# Patient Record
Sex: Male | Born: 1950
Health system: Southern US, Community
[De-identification: ages and names within clinical notes are randomized; demographics above are authoritative.]

## PROBLEM LIST (undated history)

## (undated) DIAGNOSIS — I10 Essential (primary) hypertension: Secondary | ICD-10-CM

## (undated) DIAGNOSIS — E7800 Pure hypercholesterolemia, unspecified: Secondary | ICD-10-CM

## (undated) DIAGNOSIS — E119 Type 2 diabetes mellitus without complications: Secondary | ICD-10-CM

## (undated) DIAGNOSIS — G4730 Sleep apnea, unspecified: Secondary | ICD-10-CM

## (undated) DIAGNOSIS — Q159 Congenital malformation of eye, unspecified: Secondary | ICD-10-CM

## (undated) DIAGNOSIS — I639 Cerebral infarction, unspecified: Secondary | ICD-10-CM

## (undated) HISTORY — PX: COLONOSCOPY: SHX174

## (undated) HISTORY — PX: EYE SURGERY: SHX253

## (undated) HISTORY — PX: CHOLECYSTECTOMY: SHX55

## (undated) HISTORY — PX: HEMORRHOID SURGERY: SHX153

## (undated) HISTORY — PX: APPENDECTOMY: SHX54

## (undated) HISTORY — PX: FRACTURE SURGERY: SHX138

---

## 2003-10-18 ENCOUNTER — Emergency Department (HOSPITAL_COMMUNITY): Admission: EM | Admit: 2003-10-18 | Discharge: 2003-10-18 | Payer: Self-pay | Admitting: Emergency Medicine

## 2003-10-20 ENCOUNTER — Ambulatory Visit (HOSPITAL_COMMUNITY): Admission: RE | Admit: 2003-10-20 | Discharge: 2003-10-20 | Payer: Self-pay | Admitting: Family Medicine

## 2005-07-28 ENCOUNTER — Ambulatory Visit (HOSPITAL_COMMUNITY): Admission: RE | Admit: 2005-07-28 | Discharge: 2005-07-28 | Payer: Self-pay | Admitting: General Surgery

## 2005-07-28 ENCOUNTER — Encounter (INDEPENDENT_AMBULATORY_CARE_PROVIDER_SITE_OTHER): Payer: Self-pay | Admitting: General Surgery

## 2006-07-16 ENCOUNTER — Inpatient Hospital Stay (HOSPITAL_COMMUNITY): Admission: EM | Admit: 2006-07-16 | Discharge: 2006-07-19 | Payer: Self-pay | Admitting: Emergency Medicine

## 2006-07-17 ENCOUNTER — Ambulatory Visit: Payer: Self-pay | Admitting: Cardiology

## 2007-01-22 ENCOUNTER — Ambulatory Visit (HOSPITAL_COMMUNITY): Admission: RE | Admit: 2007-01-22 | Discharge: 2007-01-22 | Payer: Self-pay | Admitting: Family Medicine

## 2007-02-22 ENCOUNTER — Ambulatory Visit (HOSPITAL_COMMUNITY): Admission: RE | Admit: 2007-02-22 | Discharge: 2007-02-22 | Payer: Self-pay | Admitting: Family Medicine

## 2007-02-28 ENCOUNTER — Ambulatory Visit: Payer: Self-pay | Admitting: Internal Medicine

## 2007-03-26 ENCOUNTER — Ambulatory Visit (HOSPITAL_COMMUNITY): Admission: RE | Admit: 2007-03-26 | Discharge: 2007-03-26 | Payer: Self-pay | Admitting: Internal Medicine

## 2007-03-26 ENCOUNTER — Encounter (INDEPENDENT_AMBULATORY_CARE_PROVIDER_SITE_OTHER): Payer: Self-pay | Admitting: Specialist

## 2007-03-26 ENCOUNTER — Ambulatory Visit: Payer: Self-pay | Admitting: Internal Medicine

## 2008-11-28 IMAGING — CT CT CHEST W/ CM
2 of 3 series · 15 of 36 positions shown, 18 images · IV contrast (omnipaque)
Comparison: Comparison is made with chest x-ray 01/22/07.

CLINICAL DATA: Possible lung nodule on chest x-ray 01/22/07.
CHEST CT WITH CONTRAST:
TECHNIQUE: Multidetector CT imaging of the chest was performed following the standard protocol during bolus administration of intravenous contrast.
Contrast:  80 cc Omnipaque 300.

[Series 2: chestroutine 5.0 b40f · axial · 0.78mm/px · z∈[-383,-98]mm · 12 of 67 slices shown, 15 images]
[im 5/67  mediastinal]
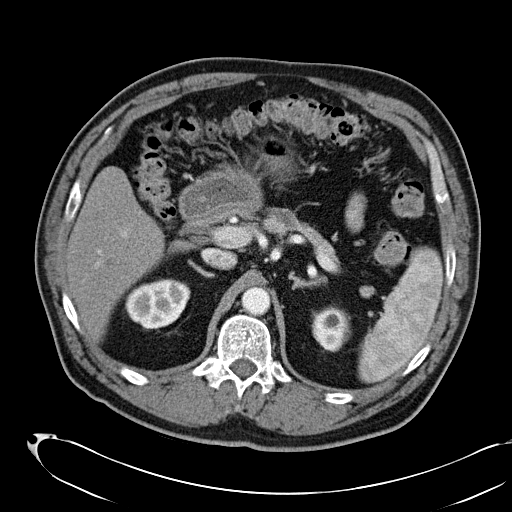
[im 5/67  lung]
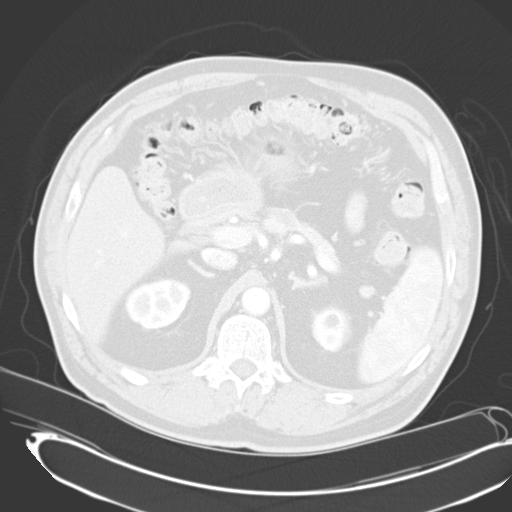
[im 10/67  lung]
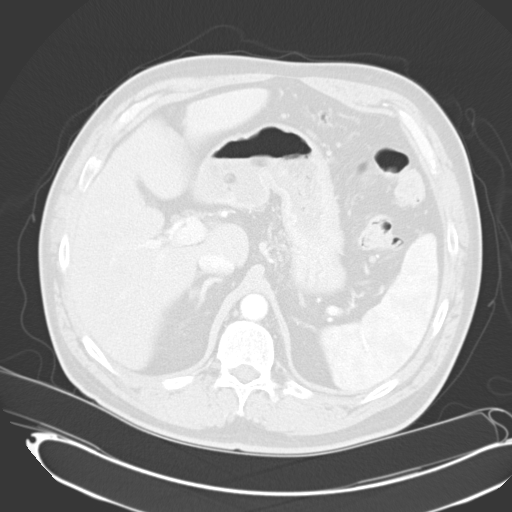
[im 15/67  lung]
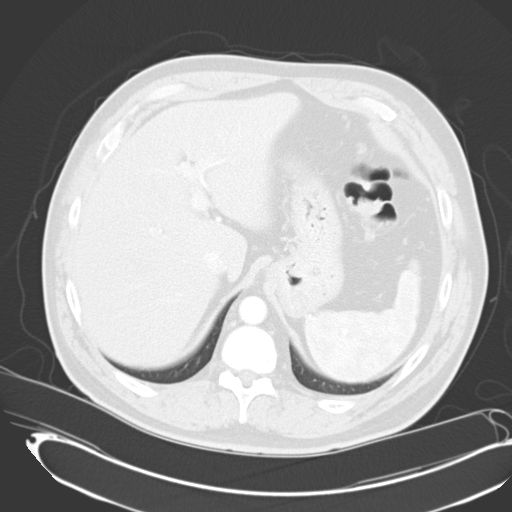
[im 20/67  lung]
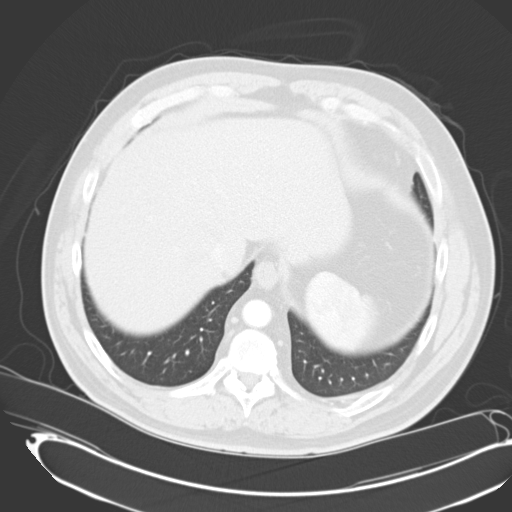
[im 25/67  mediastinal]
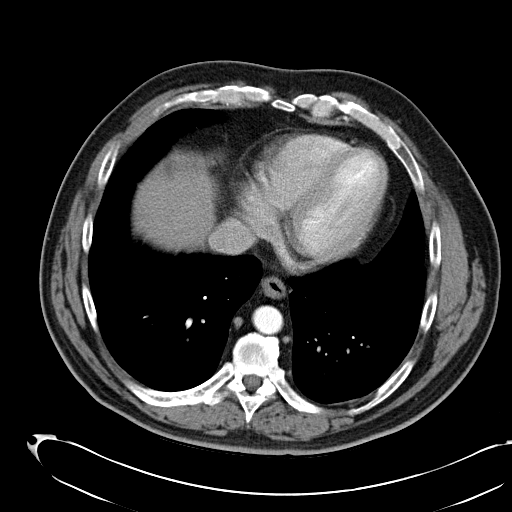
[im 25/67  lung]
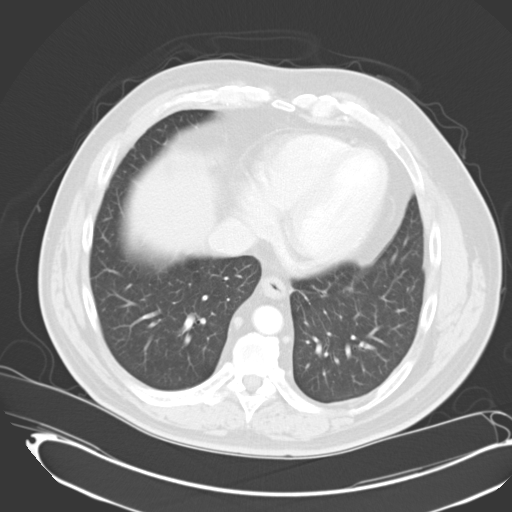
[im 30/67  lung]
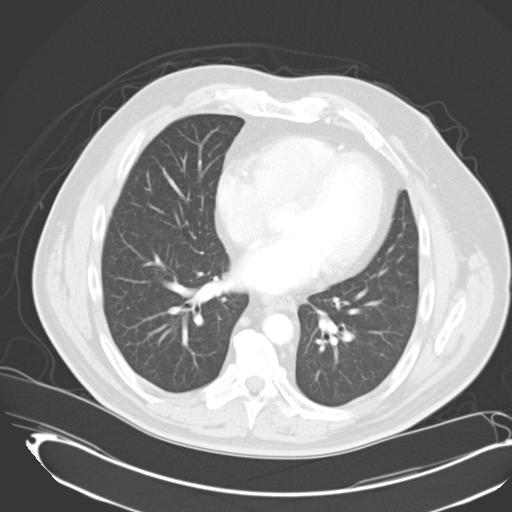
[im 37/67  lung]
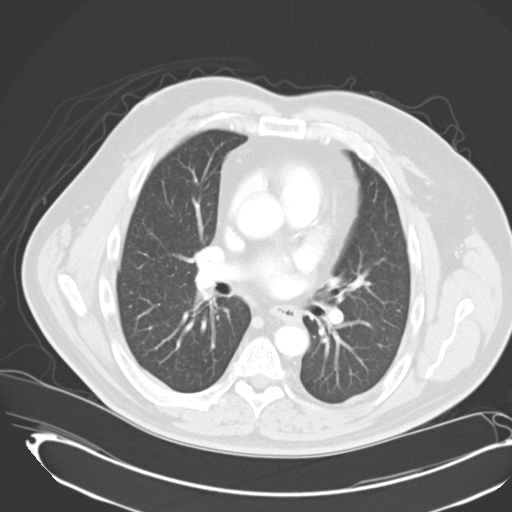
[im 42/67  lung]
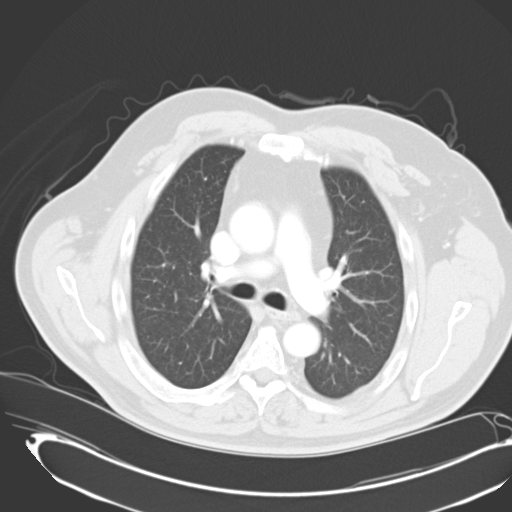
[im 47/67  mediastinal]
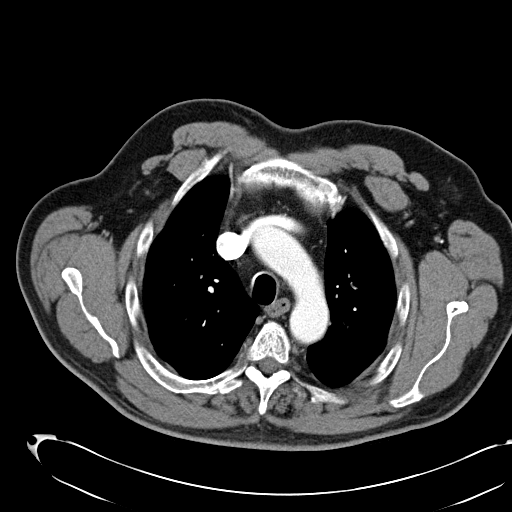
[im 47/67  lung]
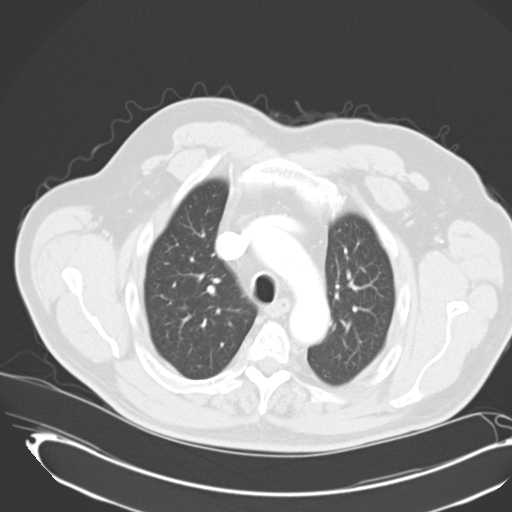
[im 52/67  lung]
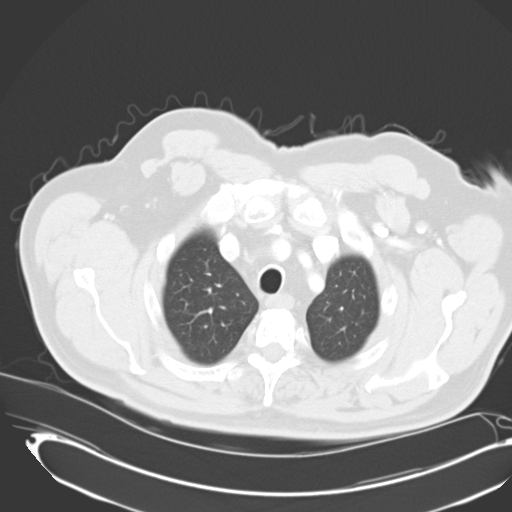
[im 57/67  lung]
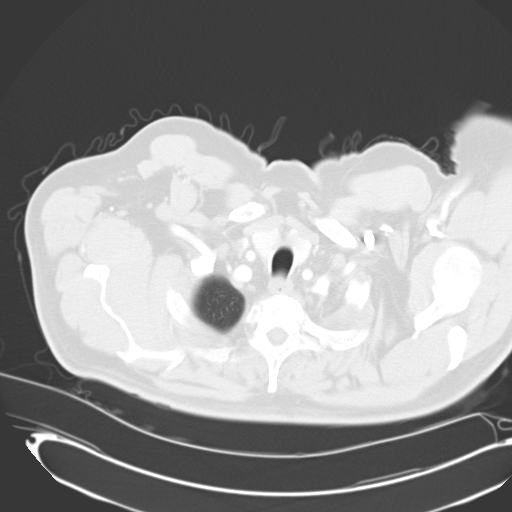
[im 62/67  lung]
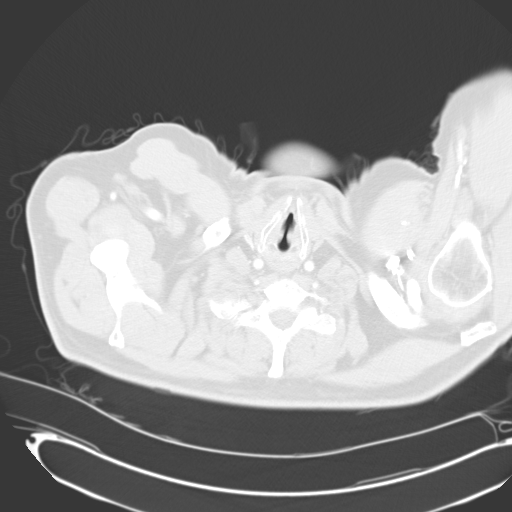

[Series 4: mpr coronal chest · coronal · 0.67mm/px · 3 of 105 slices shown]
[im 21/105  lung]
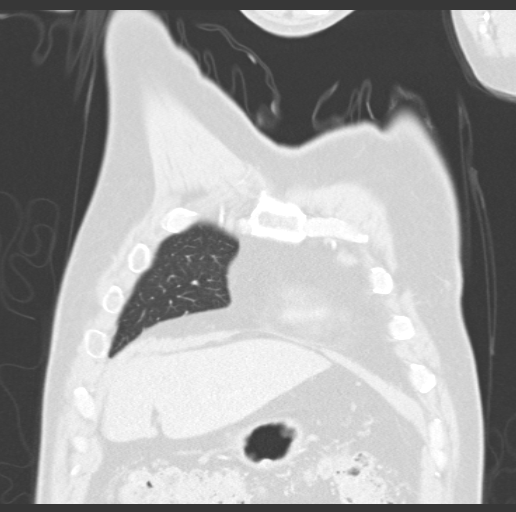
[im 42/105  lung]
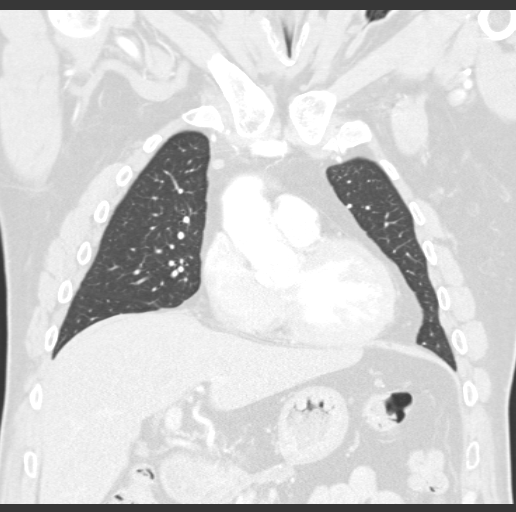
[im 63/105  lung]
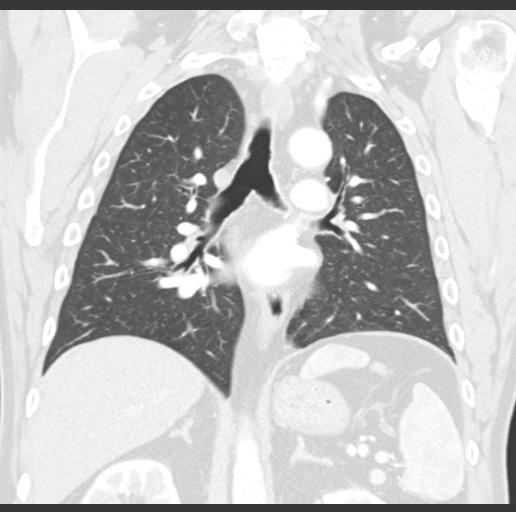

[15 of 36 positions shown; findings below may reference images not displayed]

FINDINGS: No pathologically enlarged mediastinal, hilar, or axillary lymph nodes.  Heart size at the upper limits of normal. No pericardial fluid. Coronary artery calcification.  Lungs are clear. No pulmonary nodule to explain questioned finding on recent chest x-ray.  No pleural fluid.  Airway unremarkable. 
Incidental imaging of the upper abdomen shows stable atrophy of the pancreas with focal low-attenuation lesion or ductal dilatation in the pancreatic body, measuring approximately 11 mm, stable.  Low-attenuation lesion in the upper pole of the left kidney is likely a cyst. No worrisome lytic or sclerotic lesions.
IMPRESSION: 1.  No finding to explain questioned abnormality on recent chest x-ray. 
2.  ancreatic atrophy with focal low-attenuation lesion or ductal prominence, stable from 3449.

## 2009-07-11 ENCOUNTER — Emergency Department (HOSPITAL_COMMUNITY): Admission: EM | Admit: 2009-07-11 | Discharge: 2009-07-11 | Payer: Self-pay | Admitting: Emergency Medicine

## 2009-07-11 ENCOUNTER — Encounter: Payer: Self-pay | Admitting: Orthopedic Surgery

## 2009-07-13 ENCOUNTER — Ambulatory Visit: Payer: Self-pay | Admitting: Orthopedic Surgery

## 2009-07-13 DIAGNOSIS — Z8679 Personal history of other diseases of the circulatory system: Secondary | ICD-10-CM | POA: Insufficient documentation

## 2009-07-13 DIAGNOSIS — S92009A Unspecified fracture of unspecified calcaneus, initial encounter for closed fracture: Secondary | ICD-10-CM

## 2009-07-13 DIAGNOSIS — E119 Type 2 diabetes mellitus without complications: Secondary | ICD-10-CM

## 2009-07-15 ENCOUNTER — Ambulatory Visit: Payer: Self-pay | Admitting: Orthopedic Surgery

## 2009-07-16 ENCOUNTER — Telehealth: Payer: Self-pay | Admitting: Orthopedic Surgery

## 2009-07-30 ENCOUNTER — Telehealth: Payer: Self-pay | Admitting: Orthopedic Surgery

## 2009-07-30 ENCOUNTER — Emergency Department (HOSPITAL_COMMUNITY): Admission: EM | Admit: 2009-07-30 | Discharge: 2009-07-30 | Payer: Self-pay | Admitting: Emergency Medicine

## 2009-11-27 DIAGNOSIS — I639 Cerebral infarction, unspecified: Secondary | ICD-10-CM

## 2009-11-27 HISTORY — DX: Cerebral infarction, unspecified: I63.9

## 2011-03-03 LAB — GLUCOSE, CAPILLARY: Glucose-Capillary: 106 mg/dL — ABNORMAL HIGH (ref 70–99)

## 2011-03-03 LAB — CBC
HCT: 33.9 % — ABNORMAL LOW (ref 39.0–52.0)
Hemoglobin: 11.9 g/dL — ABNORMAL LOW (ref 13.0–17.0)
MCHC: 34.9 g/dL (ref 30.0–36.0)
MCV: 87 fL (ref 78.0–100.0)
Platelets: 250 10*3/uL (ref 150–400)
RBC: 3.9 MIL/uL — ABNORMAL LOW (ref 4.22–5.81)
RDW: 15.3 % (ref 11.5–15.5)
WBC: 7.7 10*3/uL (ref 4.0–10.5)

## 2011-03-03 LAB — URINALYSIS, ROUTINE W REFLEX MICROSCOPIC
Bilirubin Urine: NEGATIVE
Glucose, UA: NEGATIVE mg/dL
Hgb urine dipstick: NEGATIVE
Ketones, ur: NEGATIVE mg/dL
Nitrite: NEGATIVE
Protein, ur: NEGATIVE mg/dL
Specific Gravity, Urine: 1.02 (ref 1.005–1.030)
Urobilinogen, UA: 1 mg/dL (ref 0.0–1.0)
pH: 5.5 (ref 5.0–8.0)

## 2011-03-03 LAB — DIFFERENTIAL
Basophils Absolute: 0 10*3/uL (ref 0.0–0.1)
Basophils Relative: 0 % (ref 0–1)
Eosinophils Absolute: 0 10*3/uL (ref 0.0–0.7)
Eosinophils Relative: 1 % (ref 0–5)
Lymphocytes Relative: 16 % (ref 12–46)
Lymphs Abs: 1.3 10*3/uL (ref 0.7–4.0)
Monocytes Absolute: 0.6 10*3/uL (ref 0.1–1.0)
Monocytes Relative: 8 % (ref 3–12)
Neutro Abs: 5.8 10*3/uL (ref 1.7–7.7)
Neutrophils Relative %: 75 % (ref 43–77)

## 2011-03-03 LAB — BASIC METABOLIC PANEL
BUN: 58 mg/dL — ABNORMAL HIGH (ref 6–23)
CO2: 24 mEq/L (ref 19–32)
Calcium: 9.5 mg/dL (ref 8.4–10.5)
Chloride: 101 mEq/L (ref 96–112)
Creatinine, Ser: 2.64 mg/dL — ABNORMAL HIGH (ref 0.4–1.5)
GFR calc Af Amer: 30 mL/min — ABNORMAL LOW (ref >60)
GFR calc non Af Amer: 25 mL/min — ABNORMAL LOW (ref >60)
Glucose, Bld: 83 mg/dL (ref 70–99)
Potassium: 5.1 mEq/L (ref 3.5–5.1)
Sodium: 133 mEq/L — ABNORMAL LOW (ref 135–145)

## 2011-04-14 NOTE — H&P (Signed)
NAME:  Marc Haynes, Marc Haynes NO.:  [id number]   MEDICAL RECORD NO.:  [id number]          PATIENT TYPE:  INP   LOCATION:  A210                          FACILITY:  [facility]   PHYSICIAN:  Patrica Duel, M.D.    DATE OF BIRTH:  [birth year]   DATE OF ADMISSION:  07/17/2006  DATE OF DISCHARGE:  LH                                HISTORY & PHYSICAL   CHIEF COMPLAINT:  Weakness of the left arm and leg.   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  He has 27-year history of diabetes mellitus and has been poorly  controlled for long-term period.  Of recent the patient has been seen by  endocrinology, his regimen changed and currently his fasting sugars are near  normal.  Hemoglobin A1c is pending.  He also has a history of hypertension  and hyperlipidemia.  Surgical history is significant for cholecystectomy  only.   The patient was doing well until he awoke the morning of the 20th with  numbness in his left arm and leg.  He had no headache, dysarthria,  dysphasia, visual disturbances.  He stated he was dragging his foot briefly.  Presented emergency department for evaluation.   Diagnostics in the ER included MRI and MRA which revealed a presence of a  thalamic infarct on the left.  MRA revealed some moderate stenosis of  internal carotid system, full report is currently pending.   The patient was admitted for ongoing management, further evaluation, therapy  as indicated.   There is no history of head trauma, nausea, vomiting, diarrhea, chest pain,  shortness of breath, palpitations, melena, hematemesis, hematochezia,  genitourinary symptoms.   Current medications include Novolin 70/30 variable doses from 50 to 80 units  b.i.d., metformin 2 grams q.h.s., Crestor 20 mg q.d., HCTZ 12.5,  lisinopril  40 q.d., Actos 45 q.d., aspirin 81 q.d. Ambien q.h.s. p.r.n.   ALLERGIES:  None documented.   PAST MEDICAL HISTORY:  Noted above.   FAMILY HISTORY:  Is significant for diabetes in his mother as well as two  grandparents which both suffered severe CVAs at young ages.   REVIEW OF SYSTEMS:  Negative except as mentioned.   SOCIAL HISTORY:  Nonsmoker, nondrinker, active lifestyle.   PHYSICAL EXAMINATION:  A very pleasant fully alert male, currently feels  somewhat improved from his present presenting status.  VITAL SIGNS:  Presentation temperature 97.1, BP 144/78, pulse 96 regular,  respirations 20, O2 sat 95%.  HEENT: Normocephalic, atraumatic.  Pupils equal.  There was no facial droop.  Ears, nose throat benign.  Neck is supple.  No audible bruits.  LUNGS: Clear to A&P.  HEART:  Sounds are normal without murmurs, rubs or gallops.  ABDOMEN:  Nontender, nondistended, bowel sounds intact.  EXTREMITIES:  No clubbing, cyanosis, edema.  NEUROLOGIC:  Essentially normal.  Toes downgoing.  No focal weakness or  sensory deficits in the distribution alluded to earlier.   ASSESSMENT:  Thalamic infarct and a long-term diabetic probably related to  small vessel disease.   PLAN:  Routine evaluation with Doppler studies,  echocardiogram, physical  therapy, occupational therapy and speech therapy consult/Dr. Darleen Crocker A.  Gerilyn Pilgrim, M.D. to comment.  Empiric therapy with Plavix and aspirin.  Will  continue home medications and follow __________      Patrica Duel, M.D.  Electronically Signed     MC/MEDQ  D:  07/17/2006  T:  07/17/2006  Job:  [id number]

## 2011-04-14 NOTE — Consult Note (Signed)
NAME:  Marc Haynes, Marc Haynes              ACCOUNT NO.:  [id number]   MEDICAL RECORD NO.:  [id number]          PATIENT TYPE:  AMB   LOCATION:  DAY                           FACILITY:  [facility]   PHYSICIAN:  Lionel December, M.D.    DATE OF BIRTH:  [birth year]   DATE OF CONSULTATION:  DATE OF DISCHARGE:                                 CONSULTATION   PRESENTING COMPLAINT:  Dysphagia.  The patient also interested in a  screening colonoscopy.   HISTORY OF PRESENT ILLNESS:  Marc Haynes is a 60 year old Caucasian male who  is referred through courtesy of Dr. Phillips Odor for GI evaluation.  he is  accompanied by his a friend.Fayrene Fearing states that he has had dysphagia  off and on for a few years.  He has difficulty both with liquids as well  as solids.  He points to mid sternal area as site of bolus obstruction.  Generally the bolus would past down, but he has had a few episodes where  he had to regurgitate liquid or solid in order to get relief.  There is  no history of odynophagia. He has had some hoarseness, but no coughing  and he experiences heartburn at a frequency of less than once a week.  He has a good appetite.  He denies abdominal pain, melena or rectal  bleeding.  He states at times, he may have difficulty swallowing his  pills.  Issue of screening colonoscopy was discussed by Dr. Phillips Odor with  the patient and he is also interested having this exam done at the time  of evaluation for dysphagia.   REVIEW OF SYSTEMS:  Negative for weight loss, chest pain or dyspnea.   He is on  1. Actos 45 mg every day.  2. Crestor 20 mg every day.  3. Hydrochlorothiazide 25 mg daily.  4. Plavix 75 mg daily.  5. Lantus 65 units q.a.m.  6. Rowasa 4 capsules or 4 grams daily, but he is not taking it      regularly.  7. Humalog sliding scale.  8. Metformin 2 grams q.h.s.  9. Benicar 40 mg daily.  10.Tylenol and/or Advil p.r.n. which he does not take very often.  11,  Simply Sleep Tylenol p.r.n.   PAST MEDICAL  HISTORY:  1. He has been diabetic since age 71.  Presently he is under care of      Dr. Leslie Dales.  He says his control has improved a great deal. His      hemoglobin A1c was over 9 and the last one he states was above 7.  2. He has been hypertensive for a few years, but has been on therapy      for 1 year.  States his blood pressure just would fluctuate too      much.  3. History of right CVA in August 2007 with almost complete recovery      of his numbness and weakness.  4. He had open cholecystectomy in 1979 for symptomatic cholelithiasis.  5. He had hemorrhoidectomy in 2006.   ALLERGY:  CORTISONE which induced pancreatitis. He had a shot to his  back in developed pancreatitis by the next morning.   FAMILY HISTORY:  Father died of complications of gallbladder disease at  age 58.  Mother is doing fair.  She has emphysema, diabetes mellitus and  CHF.  One brother died of congenital heart disease at age 83.  He has  three brothers, one is diabetic, two have CAD, one who is 57 has had  CABG, the other one is 48.   SOCIAL HISTORY:  He is single.  He has four children in good health.  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  He smoked for about  20 years, quit 20 years ago and he drinks alcohol maybe once a month or  even less frequently.   PHYSICAL EXAMINATION:  GENERAL:  Pleasant well-developed, mildly obese  Caucasian male who is in no acute distress.  VITAL SIGNS:  He weighs 204 pounds.  He is 5 feet 11 inches tall.  Pulse  88 per minute, blood pressure 146/80, temperature is 98.  HEENT:  Conjunctivae is pink.  Sclerae is nonicteric.  Oral pharyngeal  mucosa is normal.  No thyromegaly or lymphadenopathy noted.  HEART:  Cardiac exam with regular rhythm.  Normal S1-S3.  No murmur or  gallop noted.  LUNGS:  Clear to auscultation.  ABDOMEN: Full, bowel sounds are normal.  No bruits noted.  Palpation  reveals soft abdomen without tenderness, organomegaly or masses.   RECTAL:  Examination deferred.  EXTREMITIES:  No peripheral edema or clubbing noted.   ASSESSMENT:  [name] is a 60 year old Caucasian male who presents with a  several year history of intermittent dysphagia both solids as well as  liquids.  He does not have typical symptoms of gastroesophageal reflux  disease.  I suspect he has Schatzki's ring, but he may well have  esophageal stricture.  Given history of longstanding diabetes mellitus  one has to wonder if he has occult reflux disease due to gastroparesis.  However, he does not have early satiety, nausea or vomiting.  This  symptom will be further evaluated with esophagogastroduodenoscopy.  1. He is average risk for screening esophagogastroduodenoscopy.  2. He is average risk for colorectal carcinoma.   RECOMMENDATIONS:  He will undergo esophagogastroduodenoscopy and  possible esophageal dilation along with screening colonoscopy at [facility] in  the near future.   His Plavix will be held for 3 days, but in all likelihood he will be  able to go back on it on the day of procedure.   He will hold off his Actos and metformin the day before and decrease his  Lantus to 35 units and continue his Humalog via sliding scale.  Should  he develop hypoglycemic symptoms, he is free to use orange juice.  I  have reviewed both procedures with the patient and he is agreeable.   We appreciate the opportunity to participate in the care of this  gentleman.      Lionel December, M.D.  Electronically Signed     NR/MEDQ  D:  02/28/2007  T:  03/01/2007  Job:  [id number]   cc:   Veverly Fells. Altheimer, M.D.  Fax: [phone]

## 2011-04-14 NOTE — Consult Note (Signed)
NAME:  Marc Haynes, Marc Haynes              ACCOUNT NO.:  [id number]   MEDICAL RECORD NO.:  [id number]          PATIENT TYPE:  INP   LOCATION:  A210                          FACILITY:  [facility]   PHYSICIAN:  Kofi A. Gerilyn Pilgrim, M.D. DATE OF BIRTH:  Feb 06, 1951   DATE OF CONSULTATION:  07/17/2006  DATE OF DISCHARGE:                                   CONSULTATION   HISTORY:  This is a 60 year old white male who presents with numbness and  tingling in the left upper extremity and left leg.  He also had some  symptoms involving the left chest, but only a small portion.  The patient's  symptoms lasted for several hours to a day.  He feels as if he is almost  back to baseline.   PAST MEDICAL HISTORY:  1. Significant for a 27-year history of diabetes, which has been poorly      controlled until recently.  2. History of hypertension.  3. Dyslipidemia.   MEDICATIONS:  The admission medications were insulin, metformin, Crestor,  hydrochlorothiazide, Lisinopril, Actos, aspirin, and Ambien.   ALLERGIES:  None.   REVIEW OF SYSTEMS:  Unremarkable other than as in the History of Present  Illness.   FAMILY HISTORY:  Significant for several members who have cerebrovascular  disease at an early age.   PHYSICAL EXAMINATION:  VITAL SIGNS:  Temperature 97.4.  Pulse 74.  Respirations 20.  Blood pressure 127/77.  HEENT:  Evaluation shows neck is supple.  Head is normocephalic, atraumatic.  LUNGS:  The patient has significant crowding of the posterior air space  suggesting possibly a risk for apnea.  MENTATION:  The patient is awake and alert.  No dysarthria is noted.  Speech  is normal.  Language and cognition are intact.  NEUROLOGIC:  On cranial nerve evaluation, pupils are equal, round, and  reactive to light and accommodation.  The extraocular movements are full.  Facial muscles are symmetric.  Tongue is midline.  Uvula is midline.  Shoulder shrugs are normal.  Motor examination shows normal tone, bulk,  and  strength.  Sensation today reveals normal sensory exam to temperature and  light touch.  Coordination is intact.  Reflexes are actually preserved.   DIAGNOSTIC STUDIES:  MRI of the brain shows a right thalamic acute infarct.  It also involved the internal capsule on the same side.  There are no  specific white matter changes thought to be ischemic in nature.   MRA shows some irregular arteries involving the right A1 of the anterior  cerebral artery.  Otherwise, intracranial vasculature looks good.  The  extracranial vasculature also looks good.   Carotid Doppler shows no hemodynamically significant stenosis.   Echocardiography shows normal systolic function and otherwise unremarkable  exam.   ASSESSMENT:  1. Acute right thalamic infarct.  2. Risk factors of hypertension, diabetes, and dyslipidemia.   RECOMMENDATIONS:  1. He is currently on aspirin and Plavix, which I think is fine.  Long-      term he will only need one antiplatelet agent.  2. Additional blood testing for a homocystine level.  3. I think there  is a good chance that he could have untreated apnea,      which should be addressed to further reduce cardiovascular and      cerebrovascular risks.   Thanks for this consultation.      Kofi A. Gerilyn Pilgrim, M.D.  Electronically Signed     KAD/MEDQ  D:  07/18/2006  T:  07/18/2006  Job:  [id number]

## 2011-04-14 NOTE — Procedures (Signed)
NAME:  Marc Haynes, Marc Haynes NO.:  [id number]   MEDICAL RECORD NO.:  [id number]          PATIENT TYPE:  INP   LOCATION:  A210                          FACILITY:  [facility]   PHYSICIAN:  Gerrit Friends. Dietrich Pates, MD, FACCDATE OF BIRTH:  1951-09-29   DATE OF PROCEDURE:  07/17/2006  DATE OF DISCHARGE:                                  ECHOCARDIOGRAM   REFERRING [facility] CLINICAL DATA:  This is a 59 year old gentleman with CVA  and hypertension.   M-MODE:  Aorta 3.6, left atrium 3.6, septum 1.3, posterior wall 1.3, LV  diastole 3.8, LV systole 2.3.   1. Technically suboptimal but adequate echocardiographic study.  2. Left atrial size at the upper limit of normal; normal right atrium and      right ventricle.  3. Normal aortic, mitral, tricuspid and pulmonic valves; systolic anterior      motion of the cords, which is a normal variant.  4. Normal pulmonary artery and aortic arch.  5. Normal left ventricular size; mild concentric hypertrophy; normal      regional and global function.  6. Normal regional and global function.  7. Normal IVC.      Gerrit Friends. Dietrich Pates, MD, [facility]  Electronically Signed     RMR/MEDQ  D:  07/18/2006  T:  07/18/2006  Job:  [id number]

## 2011-04-14 NOTE — Discharge Summary (Signed)
NAME:  Marc Haynes, Marc Haynes NO.:  [id number]   MEDICAL RECORD NO.:  [id number]          PATIENT TYPE:  INP   LOCATION:  A210                          FACILITY:  [facility]   PHYSICIAN:  Patrica Duel, M.D.    DATE OF BIRTH:  [birth year]   DATE OF ADMISSION:  07/16/2006  DATE OF DISCHARGE:  08/23/2007LH                                 DISCHARGE SUMMARY   DISCHARGE DIAGNOSES:  1. Thalamic infarct documented by MRA revealing bilateral carotid      bifurcation and proximal internal carotid artery plaque without      hemodynamically significant stenosis.  Focal stenosis of the origin of      the left external carotid of questionable significance.  Symptoms      resolved, stable at this time.  2. A 27-year history of diabetes mellitus with poor control for long-term,      recently much improved control.  3. Hypertension.  4. Hyperlipidemia.  5. Status post cholecystectomy.   For details regarding admission please refer to the admitting note.  Briefly, this 60 year old male with the above history was doing well until  he woke the morning of the 20th with numbness of his left arm and leg.  He  had some gait disturbance, but no other neurologic deficits.   He was seen in the emergency department and an MRI was performed which  revealed the presence of a thalamic infarct on the left.  MRA results as  noted above.   The patient was admitted for ongoing management, further evaluation and  therapy of acute CVA.   COURSE IN THE HOSPITAL:  Patient did remarkably well in the hospital with  near total resolution of his symptoms within 24 hours of onset.  The patient  was begun on Plavix and aspirin immediately.  He was seen by Dr. Gerilyn Pilgrim  who agreed with current management.  Homocysteine level was obtained which  was negative.  He has had no swallowing difficulties or ongoing gait  disturbances.  He is stable for discharge at this time.   DISPOSITION:  Medication will include Plavix  75 mg daily, Novolin 70/30 in  variable doses on sliding scale as per endocrinology, metformin 2 g nightly,  Crestor 20 mg daily, HCTZ 12.5 daily, lisinopril 40 daily, Actos 45 mg  daily, aspirin 81 daily, and Ambien nightly p.r.n. in addition of Plavix.  He will be followed and treated expectantly in the office as well as by Dr.  Gerilyn Pilgrim.  A sleep study will be performed.      Patrica Duel, M.D.  Electronically Signed     MC/MEDQ  D:  07/19/2006  T:  07/19/2006  Job:  [id number]

## 2011-04-14 NOTE — Op Note (Signed)
NAME:  Marc Haynes, Marc Haynes              ACCOUNT NO.:  [id number]   MEDICAL RECORD NO.:  [id number]          PATIENT TYPE:  AMB   LOCATION:  DAY                           FACILITY:  [facility]   PHYSICIAN:  Lionel December, M.D.    DATE OF BIRTH:  December 05, 1950   DATE OF PROCEDURE:  03/26/2007  DATE OF DISCHARGE:                               OPERATIVE REPORT   PROCEDURE:  Esophagogastroduodenoscopy with esophageal dilation followed  by colonoscopy.   INDICATIONS:  Khush is a 60 year old African American male who presents  with history of intermittent dysphagia to solids as well as liquids  which he has had for few years but occurring more and more frequently.  He does not have any symptoms to suggest GERD.  He is also undergoing  average risk screening colonoscopy.  The procedure risks were reviewed  the patient, and informed consent was obtained.   MEDICATIONS FOR CONSCIOUS SEDATION:  Benzocaine spray for pharyngeal  topical anesthesia, Demerol 50 mg IV, Versed 10 mg IV in divided dose.   FINDINGS:  The procedure was performed in the endoscopy suite.  The  patient's vital signs and O2 saturations were monitored during the  procedure and remained stable.   PROCEDURE #1, ESOPHAGOGASTRODUODENOSCOPY:  The patient was placed in the  left lateral recumbent position, and the Pentax videoscope was passed  via oropharynx without any difficulty into the esophagus.   Esophagus:  The mucosa of the esophagus was normal.  The GE junction was  at 41 cm from the incisors and was unremarkable.  No mucosal wrinkling  was noted to suggest eosinophilic esophagitis.   Stomach:  Other than a small amount of bile, it was empty.  It distended  very well with insufflation.  The folds of the proximal stomach were  normal.  Examination of the mucosa revealed patchy erythema and  granularity at antrum, but no erosions or ulcers were noted.  The  pyloric channel was patent.  The angularis, fundus and cardia examined  by retroflexing the scope and were normal.   Duodenum:  The bulbar mucosa was normal.  The scope was passed into the  second part of the duodenum where mucosa and folds were normal.   The endoscope was withdrawn.   The esophagus was dilated by passing 54 and 56-French Maloney dilator to  full insertion.  The esophageal mucosa was examined post-dilation, and  there was no mucosal disruption noted.  The endoscope was withdrawn and  the patient prepared for procedure #2.   PROCEDURE #2, COLONOSCOPY:  Rectal examination was performed.  No  abnormality noted external or digital exam.  The Pentax videoscope was  placed in the rectum and advanced under vision into the sigmoid colon  and beyond.  The preparation was satisfactory.  The scope was passed  into the cecum which was identified by appendiceal orifice and ileocecal  valve which was lipomatous.  Pictures were taken for the record.  As the  scope was withdrawn, the colonic mucosa was carefully examined.  There  was a 4-mm polyp at ascending colon which was ablated via cold biopsy.  The  mucosa of the rest of the colon was normal.  The rectal mucosa  similarly was normal.  The scope was retroflexed to examine the  anorectal junction which was unremarkable.  The endoscope was  straightened and withdrawn.  The patient tolerated the procedure well.   IMPRESSION:  1. Normal esophagogastroduodenoscopy, other than nonerosive antral      gastritis.  Helicobacter pylori infection needs to be ruled out.      2.  Esophagus dilated by passing 54 and 56-French Maloney dilators,      given history of dysphagia but without causing mucosal disruption.  2. A 4-mm polyp ablated via cold biopsy from the ascending colon.   RECOMMENDATIONS:  1. The patient will resume his Plavix and his other medications as      before.  2. I will be contacting the patient with the results of biopsy and      further recommendations.  If he remains with dysphagia, will       consider barium pill study prior to esophageal manometry.      Lionel December, M.D.  Electronically Signed     NR/MEDQ  D:  03/26/2007  T:  03/26/2007  Job:  [id number]   cc:   Corrie Mckusick, M.D.  Fax: [phone]   Veverly Fells. Altheimer, M.D.  Fax: [phone]

## 2011-04-14 NOTE — H&P (Signed)
NAME:  Marc Haynes, Marc Haynes NO.:  [id number]   MEDICAL RECORD NO.:  [id number]           PATIENT TYPE:  AMB   LOCATION:                                FACILITY:  [facility]   PHYSICIAN:  Dalia Heading, M.D.  DATE OF BIRTH:  05-Sep-1951   DATE OF ADMISSION:  07/28/2005  DATE OF DISCHARGE:  LH                                HISTORY & PHYSICAL   CHIEF COMPLAINT:  Thrombosed hemorrhoid.   HISTORY OF PRESENT ILLNESS:  The patient is a 60 year old, white male who is  referred for evaluation and treatment of a thrombosed hemorrhoid.  He was  seen by Dr. Phillips Odor recently and started on Avelox.  He has had problems  with hemorrhoids before.  He started draining pus from the hemorrhoid  yesterday.  He feels better, although he is still sore.   PAST MEDICAL HISTORY:  1.  Non-insulin dependent diabetes mellitus.  2.  Pancreatitis.   PAST SURGICAL HISTORY:  Cholecystectomy.   CURRENT MEDICATIONS:  Insulin 80 units q.a.m., 80 units q.p.m.   ALLERGIES:  No known drug allergies.   REVIEW OF SYSTEMS:  Noncontributory.   PHYSICAL EXAMINATION:  GENERAL:  The patient is a well-developed, well-  nourished, white male in no acute distress.  VITAL SIGNS:  Afebrile, vital signs stable.  LUNGS:  Clear to auscultation with equal breath sounds bilaterally.  HEART:  Regular rate and rhythm without S3, S4 or murmurs.  RECTAL:  Necrotic, draining, external hemorrhoid noted at the 12 o'clock  position.  Examination is limited secondary to pain.   IMPRESSION:  Thrombosed hemorrhoid.   PLAN:  The patient is scheduled for hemorrhoidectomy on July 28, 2005.  The risks and benefits of the procedure including bleeding, infection and  recurrence of hemorrhoidal disease were fully explained to the patient  gaining informed consent.      Dalia Heading, M.D.  Electronically Signed     MAJ/MEDQ  D:  07/25/2005  T:  07/25/2005  Job:  [id number]   cc:   Corrie Mckusick, M.D.  Fax: [phone]

## 2011-04-14 NOTE — Op Note (Signed)
NAME:  MARCUS, SCHWANDT NO.:  [id number]   MEDICAL RECORD NO.:  [id number]          PATIENT TYPE:  AMB   LOCATION:  DAY                           FACILITY:  [facility]   PHYSICIAN:  Dalia Heading, M.D.  DATE OF BIRTH:  [birth year]   DATE OF PROCEDURE:  07/28/2005  DATE OF DISCHARGE:                                 OPERATIVE REPORT   PREOPERATIVE DIAGNOSES:  1.  Hemorrhoidal disease.  2.  Skin tag right buttock.   POSTOPERATIVE DIAGNOSES:  1.  Hemorrhoidal disease.  2.  Skin tag right buttock.   PROCEDURE:  1.  Hemorrhoidectomy.  2.  Excision of skin tag, right buttock.   SURGEON:  Franky Macho, M.D.   ANESTHESIA:  General.   INDICATIONS:  The patient is a 60 year old white male who presents with a  necrotic external hemorrhoid at the 12 o'clock position.  He also has a skin  tag right buttock.  The risks and benefits of both procedures including  bleeding, infection, and recurrence of the hemorrhoidal disease were fully  explained to the patient, who gave informed consent.   DESCRIPTION OF PROCEDURE:  The patient was placed in the lithotomy position  after general anesthesia was administered.  The perineum was prepped and  draped using the usual sterile technique with Betadine.  Surgical site  confirmation was performed.   A necrotic hemorrhoid external hemorrhoid was noted at the 12 o'clock  position. This was excised without difficulty.  Any granulation tissue was  debrided. Mucosal edges were then reapproximated using a 2-0 Vicryl running  suture.  No other significant hemorrhoidal disease was noted.  Rectal  examination was unremarkable.  Sensorcaine 0.5% was instilled in the  surrounding perineum.  Next, a skin tag on the right buttock was excised  using Bovie electrocautery to the skin level without difficulty.  It was  sent to pathology for examination.  Neosporin ointment and dry sterile  dressing were applied.   All tape and needle counts were  correct at the end of the procedure.  The  patient was awakened and transferred to PACU in stable condition.   COMPLICATIONS:  None.   SPECIMENS:  1.  Hemorrhoid.  2.  Skin tag.   ESTIMATED BLOOD LOSS:  Minimal.      Dalia Heading, M.D.  Electronically Signed     MAJ/MEDQ  D:  07/28/2005  T:  07/28/2005  Job:  [id number]   cc:   Corrie Mckusick, M.D.  Fax: [phone]

## 2013-12-05 ENCOUNTER — Other Ambulatory Visit: Payer: Self-pay | Admitting: Ophthalmology

## 2013-12-08 ENCOUNTER — Encounter (HOSPITAL_COMMUNITY): Payer: Self-pay | Admitting: *Deleted

## 2013-12-08 ENCOUNTER — Other Ambulatory Visit (HOSPITAL_COMMUNITY): Payer: Self-pay | Admitting: *Deleted

## 2013-12-08 NOTE — Progress Notes (Signed)
Called Dr. Loraine Leriche office [phone]) and requested most recent EKG and office visit notes. Pt states he will bring his list of meds with him day of surgery, didn't have them when I called him for his pre-op call. He states he takes all his meds at night and will not be taking any the morning of surgery. Also spoke to pt's wife per his request to give her the pre-op information.

## 2013-12-10 ENCOUNTER — Ambulatory Visit (HOSPITAL_COMMUNITY): Payer: 59 | Admitting: Certified Registered Nurse Anesthetist

## 2013-12-10 ENCOUNTER — Encounter (HOSPITAL_COMMUNITY): Payer: 59 | Admitting: Certified Registered Nurse Anesthetist

## 2013-12-10 ENCOUNTER — Encounter (HOSPITAL_COMMUNITY)
Admission: RE | Disposition: A | Discharge status: Home / Self Care | Payer: Self-pay | Source: Ambulatory Visit | Attending: Ophthalmology

## 2013-12-10 ENCOUNTER — Encounter (HOSPITAL_COMMUNITY): Payer: Self-pay | Admitting: *Deleted

## 2013-12-10 ENCOUNTER — Ambulatory Visit (HOSPITAL_COMMUNITY)
Admission: RE | Admit: 2013-12-10 | Discharge: 2013-12-10 | Disposition: A | Discharge status: Home / Self Care | Payer: 59 | Source: Ambulatory Visit | Attending: Ophthalmology | Admitting: Ophthalmology

## 2013-12-10 ENCOUNTER — Ambulatory Visit (HOSPITAL_COMMUNITY): Payer: 59

## 2013-12-10 DIAGNOSIS — H33009 Unspecified retinal detachment with retinal break, unspecified eye: Secondary | ICD-10-CM | POA: Insufficient documentation

## 2013-12-10 DIAGNOSIS — G4730 Sleep apnea, unspecified: Secondary | ICD-10-CM | POA: Insufficient documentation

## 2013-12-10 DIAGNOSIS — I10 Essential (primary) hypertension: Secondary | ICD-10-CM | POA: Insufficient documentation

## 2013-12-10 DIAGNOSIS — Z8673 Personal history of transient ischemic attack (TIA), and cerebral infarction without residual deficits: Secondary | ICD-10-CM | POA: Insufficient documentation

## 2013-12-10 DIAGNOSIS — H33001 Unspecified retinal detachment with retinal break, right eye: Secondary | ICD-10-CM

## 2013-12-10 DIAGNOSIS — Z7982 Long term (current) use of aspirin: Secondary | ICD-10-CM | POA: Insufficient documentation

## 2013-12-10 DIAGNOSIS — Z794 Long term (current) use of insulin: Secondary | ICD-10-CM | POA: Insufficient documentation

## 2013-12-10 DIAGNOSIS — H4310 Vitreous hemorrhage, unspecified eye: Secondary | ICD-10-CM | POA: Insufficient documentation

## 2013-12-10 DIAGNOSIS — E1139 Type 2 diabetes mellitus with other diabetic ophthalmic complication: Secondary | ICD-10-CM | POA: Insufficient documentation

## 2013-12-10 DIAGNOSIS — Z87891 Personal history of nicotine dependence: Secondary | ICD-10-CM | POA: Insufficient documentation

## 2013-12-10 DIAGNOSIS — E11359 Type 2 diabetes mellitus with proliferative diabetic retinopathy without macular edema: Secondary | ICD-10-CM | POA: Insufficient documentation

## 2013-12-10 HISTORY — DX: Essential (primary) hypertension: I10

## 2013-12-10 HISTORY — DX: Cerebral infarction, unspecified: I63.9

## 2013-12-10 HISTORY — DX: Type 2 diabetes mellitus without complications: E11.9

## 2013-12-10 HISTORY — DX: Sleep apnea, unspecified: G47.30

## 2013-12-10 HISTORY — PX: PARS PLANA VITRECTOMY: SHX2166

## 2013-12-10 LAB — BASIC METABOLIC PANEL
BUN: 23 mg/dL (ref 6–23)
CALCIUM: 9.9 mg/dL (ref 8.4–10.5)
CO2: 23 mEq/L (ref 19–32)
CREATININE: 0.92 mg/dL (ref 0.50–1.35)
Chloride: 100 mEq/L (ref 96–112)
GFR calc non Af Amer: 89 mL/min — ABNORMAL LOW (ref >90)
Glucose, Bld: 177 mg/dL — ABNORMAL HIGH (ref 70–99)
Potassium: 4.1 mEq/L (ref 3.7–5.3)
Sodium: 137 mEq/L (ref 137–147)

## 2013-12-10 LAB — GLUCOSE, CAPILLARY
GLUCOSE-CAPILLARY: 170 mg/dL — AB (ref 70–99)
GLUCOSE-CAPILLARY: 131 mg/dL — AB (ref 70–99)
Glucose-Capillary: 131 mg/dL — ABNORMAL HIGH (ref 70–99)

## 2013-12-10 LAB — CBC
HCT: 48 % (ref 39.0–52.0)
Hemoglobin: 16.3 g/dL (ref 13.0–17.0)
MCH: 29.9 pg (ref 26.0–34.0)
MCHC: 34 g/dL (ref 30.0–36.0)
MCV: 88.1 fL (ref 78.0–100.0)
PLATELETS: 142 10*3/uL — AB (ref 150–400)
RBC: 5.45 MIL/uL (ref 4.22–5.81)
RDW: 14.6 % (ref 11.5–15.5)
WBC: 8.5 10*3/uL (ref 4.0–10.5)

## 2013-12-10 SURGERY — PARS PLANA VITRECTOMY WITH 25 GAUGE
Anesthesia: Monitor Anesthesia Care | Site: Eye | Laterality: Right

## 2013-12-10 MED ORDER — SODIUM CHLORIDE 0.9 % IV SOLN
INTRAVENOUS | Status: DC
  Administered 2013-12-10: 12:00:00 via INTRAVENOUS

## 2013-12-10 MED ORDER — LIDOCAINE HCL 2 % IJ SOLN
INTRAMUSCULAR | Status: AC
  Filled 2013-12-10: qty 20

## 2013-12-10 MED ORDER — PROPOFOL 10 MG/ML IV BOLUS
INTRAVENOUS | Status: DC | PRN
  Administered 2013-12-10: 10 mg via INTRAVENOUS
  Administered 2013-12-10: 30 mg via INTRAVENOUS

## 2013-12-10 MED ORDER — GATIFLOXACIN 0.5 % OP SOLN
1.0000 [drp] | OPHTHALMIC | Status: AC | PRN
  Administered 2013-12-10 (×3): 1 [drp] via OPHTHALMIC
  Filled 2013-12-10: qty 2.5

## 2013-12-10 MED ORDER — SODIUM CHLORIDE 0.9 % IJ SOLN
INTRAMUSCULAR | Status: AC
  Filled 2013-12-10: qty 10

## 2013-12-10 MED ORDER — BSS IO SOLN
INTRAOCULAR | Status: DC | PRN
  Administered 2013-12-10: 15 mL via INTRAOCULAR

## 2013-12-10 MED ORDER — CYCLOPENTOLATE HCL 1 % OP SOLN
1.0000 [drp] | OPHTHALMIC | Status: AC | PRN
  Administered 2013-12-10 (×3): 1 [drp] via OPHTHALMIC
  Filled 2013-12-10: qty 2

## 2013-12-10 MED ORDER — HYPROMELLOSE (GONIOSCOPIC) 2.5 % OP SOLN
OPHTHALMIC | Status: AC
  Filled 2013-12-10: qty 15

## 2013-12-10 MED ORDER — GENTAMICIN SULFATE 40 MG/ML IJ SOLN
INTRAMUSCULAR | Status: AC
  Filled 2013-12-10: qty 2

## 2013-12-10 MED ORDER — NA CHONDROIT SULF-NA HYALURON 40-30 MG/ML IO SOLN
INTRAOCULAR | Status: AC
  Filled 2013-12-10: qty 0.5

## 2013-12-10 MED ORDER — EPINEPHRINE HCL 1 MG/ML IJ SOLN
INTRAMUSCULAR | Status: DC | PRN
Start: 2013-12-10 — End: 2013-12-10
  Administered 2013-12-10: .3 mL

## 2013-12-10 MED ORDER — ONDANSETRON HCL 4 MG/2ML IJ SOLN
INTRAMUSCULAR | Status: DC | PRN
  Administered 2013-12-10: 4 mg via INTRAVENOUS

## 2013-12-10 MED ORDER — LIDOCAINE HCL 2 % IJ SOLN
INTRAMUSCULAR | Status: DC | PRN
  Administered 2013-12-10: 10 mL

## 2013-12-10 MED ORDER — TETRACAINE HCL 0.5 % OP SOLN
OPHTHALMIC | Status: AC
  Filled 2013-12-10: qty 2

## 2013-12-10 MED ORDER — BSS PLUS IO SOLN
INTRAOCULAR | Status: AC
  Filled 2013-12-10: qty 500

## 2013-12-10 MED ORDER — PHENYLEPHRINE HCL 2.5 % OP SOLN
1.0000 [drp] | OPHTHALMIC | Status: AC | PRN
  Administered 2013-12-10 (×3): 1 [drp] via OPHTHALMIC
  Filled 2013-12-10: qty 15

## 2013-12-10 MED ORDER — DEXAMETHASONE SODIUM PHOSPHATE 10 MG/ML IJ SOLN
INTRAMUSCULAR | Status: DC | PRN
Start: 2013-12-10 — End: 2013-12-10
  Administered 2013-12-10: 10 mg

## 2013-12-10 MED ORDER — SODIUM CHLORIDE 0.9 % IV SOLN
INTRAVENOUS | Status: DC | PRN
  Administered 2013-12-10: 15:00:00 via INTRAVENOUS

## 2013-12-10 MED ORDER — 0.9 % SODIUM CHLORIDE (POUR BTL) OPTIME
TOPICAL | Status: DC | PRN
  Administered 2013-12-10: 200 mL

## 2013-12-10 MED ORDER — SODIUM HYALURONATE 10 MG/ML IO SOLN
INTRAOCULAR | Status: AC
  Filled 2013-12-10: qty 0.85

## 2013-12-10 MED ORDER — POLYMYXIN B SULFATE 500000 UNITS IJ SOLR
INTRAMUSCULAR | Status: AC
  Filled 2013-12-10: qty 1

## 2013-12-10 MED ORDER — STERILE WATER FOR IRRIGATION IR SOLN
Status: DC | PRN
  Administered 2013-12-10: 200 mL

## 2013-12-10 MED ORDER — EPINEPHRINE HCL 1 MG/ML IJ SOLN
INTRAMUSCULAR | Status: DC | PRN
  Administered 2013-12-10: 15:00:00

## 2013-12-10 MED ORDER — EPINEPHRINE HCL 1 MG/ML IJ SOLN
INTRAMUSCULAR | Status: AC
  Filled 2013-12-10: qty 1

## 2013-12-10 MED ORDER — MIDAZOLAM HCL 5 MG/5ML IJ SOLN
INTRAMUSCULAR | Status: DC | PRN
  Administered 2013-12-10 (×2): 2 mg via INTRAVENOUS

## 2013-12-10 MED ORDER — HYPROMELLOSE (GONIOSCOPIC) 2.5 % OP SOLN
OPHTHALMIC | Status: DC | PRN
  Administered 2013-12-10: 1 [drp] via OPHTHALMIC

## 2013-12-10 MED ORDER — DEXAMETHASONE SODIUM PHOSPHATE 10 MG/ML IJ SOLN
INTRAMUSCULAR | Status: AC
  Filled 2013-12-10: qty 1

## 2013-12-10 SURGICAL SUPPLY — 68 items
APL SRG 3 HI ABS STRL LF PLS (MISCELLANEOUS)
APPLICATOR COTTON TIP 6IN STRL (MISCELLANEOUS) ×3 IMPLANT
APPLICATOR DR MATTHEWS STRL (MISCELLANEOUS) IMPLANT
BLADE MVR KNIFE 20G (BLADE) IMPLANT
CANNULA ANT CHAM MAIN (OPHTHALMIC RELATED) IMPLANT
CANNULA VLV SOFT TIP 25G (OPHTHALMIC) ×1 IMPLANT
CANNULA VLV SOFT TIP 25GA (OPHTHALMIC) ×3 IMPLANT
CORDS BIPOLAR (ELECTRODE) IMPLANT
COVER MAYO STAND STRL (DRAPES) ×3 IMPLANT
DRAPE INCISE 51X51 W/FILM STRL (DRAPES) IMPLANT
DRAPE OPHTHALMIC 77X100 STRL (CUSTOM PROCEDURE TRAY) ×3 IMPLANT
FILTER BLUE MILLIPORE (MISCELLANEOUS) ×2 IMPLANT
FORCEPS ECKARDT ILM 25G SERR (OPHTHALMIC RELATED) ×3 IMPLANT
FORCEPS GRIESHABER ILM 25G A (INSTRUMENTS) IMPLANT
FORCEPS HORIZONTAL 25G DISP (OPHTHALMIC RELATED) IMPLANT
FORCEPS ILM 25G DSP TIP (MISCELLANEOUS) IMPLANT
GAS AUTO FILL CONSTEL (OPHTHALMIC)
GAS AUTO FILL CONSTELLATION (OPHTHALMIC) IMPLANT
GLOVE BIO SURGEON STRL SZ 6 (GLOVE) ×2 IMPLANT
GLOVE SS BIOGEL STRL SZ 8.5 (GLOVE) ×1 IMPLANT
GLOVE SUPERSENSE BIOGEL SZ 8.5 (GLOVE) ×2
GOWN PREVENTION PLUS XLARGE (GOWN DISPOSABLE) ×3 IMPLANT
GOWN STRL NON-REIN LRG LVL3 (GOWN DISPOSABLE) ×3 IMPLANT
HANDLE PNEUMATIC FOR CONSTEL (OPHTHALMIC) IMPLANT
KIT BASIN OR (CUSTOM PROCEDURE TRAY) ×3 IMPLANT
KIT PERFLUORON PROCEDURE 5ML (MISCELLANEOUS) IMPLANT
KIT ROOM TURNOVER OR (KITS) ×3 IMPLANT
KNIFE CRESCENT 2.5 55 ANG (BLADE) IMPLANT
LENS BIOM SUPER VIEW SET DISP (OPHTHALMIC RELATED) ×3 IMPLANT
MASK EYE SHIELD (GAUZE/BANDAGES/DRESSINGS) ×2 IMPLANT
MICROPICK 25G (MISCELLANEOUS)
NDL 18GX1X1/2 (RX/OR ONLY) (NEEDLE) IMPLANT
NDL 25GX 5/8IN NON SAFETY (NEEDLE) IMPLANT
NDL FILTER BLUNT 18X1 1/2 (NEEDLE) IMPLANT
NDL HYPO 25GX1X1/2 BEV (NEEDLE) IMPLANT
NEEDLE 18GX1X1/2 (RX/OR ONLY) (NEEDLE) IMPLANT
NEEDLE 25GX 5/8IN NON SAFETY (NEEDLE) IMPLANT
NEEDLE FILTER BLUNT 18X 1/2SAF (NEEDLE)
NEEDLE FILTER BLUNT 18X1 1/2 (NEEDLE) IMPLANT
NEEDLE HYPO 25GX1X1/2 BEV (NEEDLE) IMPLANT
NS IRRIG 1000ML POUR BTL (IV SOLUTION) ×3 IMPLANT
PACK FRAGMATOME (OPHTHALMIC) IMPLANT
PACK VITRECTOMY CUSTOM (CUSTOM PROCEDURE TRAY) ×3 IMPLANT
PAD ARMBOARD 7.5X6 YLW CONV (MISCELLANEOUS) ×4 IMPLANT
PAD EYE OVAL STERILE LF (GAUZE/BANDAGES/DRESSINGS) ×2 IMPLANT
PAK PIK VITRECTOMY CVS 25GA (OPHTHALMIC) ×3 IMPLANT
PENCIL BIPOLAR 25GA STR DISP (OPHTHALMIC RELATED) IMPLANT
PICK MICROPICK 25G (MISCELLANEOUS) IMPLANT
PROBE LASER ILLUM FLEX CVD 25G (OPHTHALMIC) ×2 IMPLANT
ROLLS DENTAL (MISCELLANEOUS) IMPLANT
SCRAPER DIAMOND 25GA (OPHTHALMIC RELATED) IMPLANT
SET FLUID INJECTOR (SET/KITS/TRAYS/PACK) IMPLANT
STOCKINETTE IMPERVIOUS 9X36 MD (GAUZE/BANDAGES/DRESSINGS) ×6 IMPLANT
STOPCOCK 4 WAY LG BORE MALE ST (IV SETS) IMPLANT
SUT ETHILON 10 0 CS140 6 (SUTURE) IMPLANT
SUT ETHILON 8 0 BV130 4 (SUTURE) IMPLANT
SUT MERSILENE 5 0 RD 1 DA (SUTURE) IMPLANT
SUT PROLENE 10 0 CIF 4 DA (SUTURE) IMPLANT
SUT VICRYL 7 0 TG140 8 (SUTURE) IMPLANT
SYR 30ML SLIP (SYRINGE) IMPLANT
SYR 50ML SLIP (SYRINGE) ×2 IMPLANT
SYR 5ML LL (SYRINGE) IMPLANT
SYR TB 1ML LUER SLIP (SYRINGE) IMPLANT
TAPE SURG TRANSPORE 1 IN (GAUZE/BANDAGES/DRESSINGS) IMPLANT
TAPE SURGICAL TRANSPORE 1 IN (GAUZE/BANDAGES/DRESSINGS) ×2
TOWEL OR 17X26 10 PK STRL BLUE (TOWEL DISPOSABLE) ×6 IMPLANT
WATER STERILE IRR 1000ML POUR (IV SOLUTION) ×3 IMPLANT
WIPE INSTRUMENT VISIWIPE 73X73 (MISCELLANEOUS) ×3 IMPLANT

## 2013-12-10 NOTE — Preoperative (Signed)
Beta Blockers   Reason not to administer Beta Blockers:Not Applicable 

## 2013-12-10 NOTE — Transfer of Care (Signed)
Immediate Anesthesia Transfer of Care Note  Patient: Marc Haynes  Procedure(s) Performed: Procedure(s): PARS PLANA VITRECTOMY 25 GAUGE WITH ENDOLASER RIGHT EYE  (Right)  Patient Location: PACU  Anesthesia Type:MAC  Level of Consciousness: awake, alert , oriented and patient cooperative  Airway & Oxygen Therapy: Patient Spontanous Breathing  Post-op Assessment: Report given to PACU RN, Post -op Vital signs reviewed and stable and Patient moving all extremities X 4  Post vital signs: Reviewed and stable  Complications: No apparent anesthesia complications

## 2013-12-10 NOTE — Progress Notes (Signed)
Call to Dr. Delanna Ahmadi office, [facility] requested last OV note & last EKG.

## 2013-12-10 NOTE — Anesthesia Postprocedure Evaluation (Signed)
  Anesthesia Post-op Note  Patient: Marc Haynes  Procedure(s) Performed: Procedure(s): PARS PLANA VITRECTOMY 25 GAUGE WITH ENDOLASER RIGHT EYE  (Right)  Patient Location: PACU  Anesthesia Type:MAC  Level of Consciousness: awake, alert  and oriented  Airway and Oxygen Therapy: Patient Spontanous Breathing  Post-op Pain: none  Post-op Assessment: Post-op Vital signs reviewed, Patient's Cardiovascular Status Stable, Respiratory Function Stable, Patent Airway, No signs of Nausea or vomiting and Pain level controlled  Post-op Vital Signs: Reviewed and stable  Complications: No apparent anesthesia complications

## 2013-12-10 NOTE — H&P (Signed)
Marc Haynes is an 63 y.o. male.   Chief Complaint: Vision loss right eye with floaters. HPI: Dense preretinal and vitreous hemorrhage of the right eye from proliferative diabetic retinopathy progressive despite panretinal photocoagulation.  Past Medical History  Diagnosis Date  . Hypertension   . Diabetes mellitus without complication   . Sleep apnea     does not use CPAP  . Stroke 2011    Past Surgical History  Procedure Laterality Date  . Cholecystectomy    . Fracture surgery Left     lower leg/ankle  . Eye surgery Left     Vitrectomy, bilateral cataracts  . Appendectomy    . Colonoscopy    . Hemorrhoid surgery  2007?    History reviewed. No pertinent family history. Social History:  reports that he quit smoking about 26 years ago. His smoking use included Cigarettes. He smoked 0.00 packs per day. He has never used smokeless tobacco. He reports that he drinks alcohol. He reports that he does not use illicit drugs.  Allergies:  Allergies  Allergen Reactions  . Cortisone     Medications Prior to Admission  Medication Sig Dispense Refill  . aspirin 325 MG tablet Take 325 mg by mouth daily.      . clopidogrel (PLAVIX) 75 MG tablet Take 75 mg by mouth daily with breakfast.      . esomeprazole (NEXIUM) 40 MG capsule Take 40 mg by mouth daily at 12 noon.      . hydrochlorothiazide (MICROZIDE) 12.5 MG capsule Take 12.5 mg by mouth daily.      . insulin glargine (LANTUS) 100 UNIT/ML injection Inject 80 Units into the skin at bedtime.      . insulin lispro (HUMALOG) 100 UNIT/ML injection Inject 12-22 Units into the skin 3 (three) times daily before meals. Sliding scale      . losartan (COZAAR) 100 MG tablet Take 100 mg by mouth daily.      . metFORMIN (GLUCOPHAGE) 1000 MG tablet Take 4,000 mg by mouth at bedtime.      . Multiple Vitamin (MULTIVITAMIN WITH MINERALS) TABS tablet Take 1 tablet by mouth daily.      Marland Kitchen omega-3 acid ethyl esters (LOVAZA) 1 G capsule Take 4,000 mg  by mouth daily.      . pioglitazone (ACTOS) 45 MG tablet Take 45 mg by mouth daily.      . rosuvastatin (CRESTOR) 20 MG tablet Take 20 mg by mouth daily.      Marland Kitchen VICTOZA 18 MG/3ML SOPN Inject 1.6 mg into the skin daily.        Results for orders placed during the hospital encounter of 12/10/13 (from the past 48 hour(s))  CBC     Status: Abnormal   Collection Time    12/10/13 11:46 AM      Result Value Range   WBC 8.5  4.0 - 10.5 K/uL   RBC 5.45  4.22 - 5.81 MIL/uL   Hemoglobin 16.3  13.0 - 17.0 g/dL   HCT 48.0  39.0 - 52.0 %   MCV 88.1  78.0 - 100.0 fL   MCH 29.9  26.0 - 34.0 pg   MCHC 34.0  30.0 - 36.0 g/dL   RDW 14.6  11.5 - 15.5 %   Platelets 142 (*) 150 - 400 K/uL  GLUCOSE, CAPILLARY     Status: Abnormal   Collection Time    12/10/13 11:46 AM      Result Value Range   Glucose-Capillary 170 (*)  70 - 99 mg/dL   No results found.  Review of Systems  Constitutional: Negative.   HENT: Negative.   Eyes: Positive for blurred vision.  Respiratory: Negative.   Cardiovascular: Negative.   Gastrointestinal: Negative.   Skin: Negative.   Neurological: Negative.   Endo/Heme/Allergies: Negative.   Psychiatric/Behavioral: Negative.     Blood pressure 175/99, pulse 95, temperature 97.9 F (36.6 C), temperature source Oral, resp. rate 20, height 5\' 11"  (1.803 m), weight 97.07 kg (214 lb), SpO2 96.00%. Physical Exam  Constitutional: He is oriented to person, place, and time. Vital signs are normal. He appears well-developed and well-nourished.  HENT:  Head: Normocephalic and atraumatic.  Eyes: Pupils are equal, round, and reactive to light.  Vision OD is 20/160 and OS is vision of 20/20.  Dense vitreous hemorrhage is present over the macular of the right eye secondary to proliferative diabetic retinopathy.  Quiescent  proliferative diabetic retinopathy present left eye  Neck: Trachea normal and normal range of motion. Neck supple.  Cardiovascular: Normal rate and regular  rhythm.   Musculoskeletal: Normal range of motion.  Neurological: He is alert and oriented to person, place, and time. He has normal reflexes.  Skin: Skin is warm.  Psychiatric: He has a normal mood and affect. His speech is normal and behavior is normal. Judgment and thought content normal. Cognition and memory are normal.     Assessment/Plan #1 vitreous hemorrhage right eye from active proliferative diabetic retinopathy. #2 progressive proliferative diabetic retinopathy despite panretinal photocoagulation right eye.    Plan vitrectomy posterior, with endolaser panretinal photocoagulation right eye. Under local retrobulbar with monitored anesthesia control.  Jarmon Javid A 12/10/2013, 12:38 PM

## 2013-12-10 NOTE — Brief Op Note (Signed)
12/10/2013  3:32 PM  PATIENT:  Marc Haynes  63 y.o. male  PRE-OPERATIVE DIAGNOSIS:  VITREOUS HEMORRAGIE RIGHT EYE   POST-OPERATIVE DIAGNOSIS:  VITREOUS HEMORRAGIE RIGHT EYE   PROCEDURE:  Procedure(s): PARS PLANA VITRECTOMY 25 GAUGE WITH ENDOLASER RIGHT EYE  (Right),,, injection of SF 6 18% GAS, to repair retinal detachment  SURGEON:  Surgeon(s) and Role:    * [name], MD - Primary  PHYSICIAN ASSISTANT:   ASSISTANTS: none   ANESTHESIA:   MAC  EBL:     BLOOD ADMINISTERED:none  DRAINS: none   LOCAL MEDICATIONS USED:  XYLOCAINE 10 cc retrobulbar  SPECIMEN:  No Specimen  DISPOSITION OF SPECIMEN:  N/A  COUNTS:  YES  TOURNIQUET:  * No tourniquets in log *  DICTATION: .Other Dictation: Dictation Number [id number]  PLAN OF CARE: Discharge to home after PACU  PATIENT DISPOSITION:  PACU - hemodynamically stable.   Delay start of Pharmacological VTE agent (>24hrs) due to surgical blood loss or risk of bleeding: not applicable

## 2013-12-10 NOTE — Anesthesia Preprocedure Evaluation (Signed)
Anesthesia Evaluation  Patient identified by MRN, date of birth, ID band Patient awake    Reviewed: Allergy & Precautions, H&P , NPO status , Patient's Chart, lab work & pertinent test results  Airway   Neck ROM: Full    Dental   Pulmonary sleep apnea , former smoker,  breath sounds clear to auscultation        Cardiovascular hypertension, Rhythm:Regular Rate:Normal     Neuro/Psych    GI/Hepatic   Endo/Other  diabetes  Renal/GU      Musculoskeletal   Abdominal   Peds  Hematology   Anesthesia Other Findings   Reproductive/Obstetrics                           Anesthesia Physical Anesthesia Plan  ASA: III  Anesthesia Plan: MAC   Post-op Pain Management:    Induction: Intravenous  Airway Management Planned: Natural Airway  Additional Equipment:   Intra-op Plan:   Post-operative Plan:   Informed Consent: I have reviewed the patients History and Physical, chart, labs and discussed the procedure including the risks, benefits and alternatives for the proposed anesthesia with the patient or authorized representative who has indicated his/her understanding and acceptance.   Dental advisory given  Plan Discussed with: CRNA and Surgeon  Anesthesia Plan Comments:         Anesthesia Quick Evaluation

## 2013-12-11 NOTE — Op Note (Signed)
NAME:  ULAS, ZUERCHER NO.:  [id number]  MEDICAL RECORD NO.:  [id number]  LOCATION:  [facility]                         FACILITY:  [facility]  PHYSICIAN:  Clent Demark. Deniya Craigo, M.D.   DATE OF BIRTH:  04-04-1951  DATE OF PROCEDURE:  12/10/2013 DATE OF DISCHARGE:  12/10/2013                              OPERATIVE REPORT   PREOPERATIVE DIAGNOSIS: 1. Dense vitreous hemorrhage, nonclearing, right eye. 2. History of proliferative diabetic retinopathy, right eye.  POSTOPERATIVE DIAGNOSIS: 1. Dense vitreous hemorrhage, nonclearing, right eye. 2. History of proliferative diabetic retinopathy, right eye. 3. Low lying rhegmatogenous detachment inferotemporally from 6:30 to     the 8 o'clock position anterior to the equator.  PROCEDURE: 1. Posterior vitrectomy with endolaser panretinal photocoagulation as     well as repair of retinal detachment, right eye.  SURGEON:  [name], MD.  ANESTHESIA:  Local retrobulbar with monitored anesthesia control.  INDICATIONS FOR PROCEDURE:  The patient is a 63 year old man with profound vision loss on basis of dense vitreous hemorrhage, nonclearing __________ to be from diabetic retinopathy and/or traction.  The patient understands this in an attempt vitreous opacification and also treat any of the underlying condition that might be found to be present.  The patient understands the risks of anesthesia including rare recurrence of death, loss of the eye from the anesthesia but also from the surgical condition including but not limited to hemorrhage, infection, scarring, need for another surgery, change in vision, loss of vision, progressive disease despite intervention.  Appropriate signed consent was obtained.  DESCRIPTION OF PROCEDURE:  The patient was taken to the operating room. In the operating room, appropriate monitors were followed by mild sedation.  Appropriate sites were confirmed as the right eye with the operating staff.   Thereafter, 2% Xylocaine, 5 mL injected retrobulbar with additional 5 mL laterally in the fashion of modified Kirk Ruths.  The right periocular region was sterilely prepped and draped in usual ophthalmic fashion.  Lid speculum was applied.  A 25-gauge trocar was placed in the inferotemporal quadrant.  Placement was verified visually. Infusion turned on.  Superior trocar was applied.  Core vitrectomy was then begun.  Clearing of the visual axis was successful and dense vitreous was hemorrhage removed in this fashion.  Immediately, there was found to be a low lying detachment inferotemporally at 5:30 position to 8 o'clock position anterior to the equator.  This area was demarcated from posterior extension from previous panretinal photocoagulation was definitely present.  Small retinotomy was created in the posterior aspect of this low lying retinal detachment for later subretinal drainage.  Thick subretinal fluid was removed in this fashion.  The retina flattened nicely and quickly.  All vitreous base was trimmed to this area as well as 360 degrees.  Anterior hyaloid confirmed to be removed.  Median opacity was entirely removed.  At this time, fluid air exchange completed.  Retina reattached nicely and peripherally and subretinal fluid reaccumulated.  Preretinal fluid was aspirated on multiple occasions.  Thereafter, endolaser photocoagulation was placed on the bed of detachment and then finally peripherally and completed the panretinal photocoagulation anteriorly, superiorly, and nasally.  Air- SF6 exchange completed.  18% SF6  was used.  Superior trocars removed from the eye.  Infusion was then removed and the wounds were secured. Intraocular pressure was found to be adequate.  Subconjunctival Decadron applied.  Sterile patch fracture applied.  The patient tolerated the procedure without complications.  The patient was taken to the PACU in good stable condition and tolerating procedure well  without any complications.     Clent Demark Pedrohenrique Mcconville, M.D.     GAR/MEDQ  D:  12/10/2013  T:  12/11/2013  Job:  [id number]

## 2013-12-12 ENCOUNTER — Encounter (HOSPITAL_COMMUNITY): Payer: Self-pay | Admitting: Ophthalmology

## 2015-04-27 ENCOUNTER — Encounter: Payer: Self-pay | Admitting: Cardiovascular Disease

## 2016-12-29 ENCOUNTER — Encounter: Payer: Self-pay | Admitting: Internal Medicine

## 2017-01-25 DIAGNOSIS — E113513 Type 2 diabetes mellitus with proliferative diabetic retinopathy with macular edema, bilateral: Secondary | ICD-10-CM | POA: Diagnosis not present

## 2017-01-25 DIAGNOSIS — G4730 Sleep apnea, unspecified: Secondary | ICD-10-CM | POA: Diagnosis not present

## 2017-01-25 DIAGNOSIS — I10 Essential (primary) hypertension: Secondary | ICD-10-CM | POA: Diagnosis not present

## 2017-01-25 DIAGNOSIS — Z794 Long term (current) use of insulin: Secondary | ICD-10-CM | POA: Diagnosis not present

## 2017-01-25 DIAGNOSIS — E538 Deficiency of other specified B group vitamins: Secondary | ICD-10-CM | POA: Diagnosis not present

## 2017-01-25 DIAGNOSIS — E783 Hyperchylomicronemia: Secondary | ICD-10-CM | POA: Diagnosis not present

## 2017-01-25 DIAGNOSIS — N529 Male erectile dysfunction, unspecified: Secondary | ICD-10-CM | POA: Diagnosis not present

## 2017-01-25 DIAGNOSIS — E782 Mixed hyperlipidemia: Secondary | ICD-10-CM | POA: Diagnosis not present

## 2017-01-25 DIAGNOSIS — E1165 Type 2 diabetes mellitus with hyperglycemia: Secondary | ICD-10-CM | POA: Diagnosis not present

## 2017-01-25 DIAGNOSIS — H3530 Unspecified macular degeneration: Secondary | ICD-10-CM | POA: Diagnosis not present

## 2017-01-25 DIAGNOSIS — G4700 Insomnia, unspecified: Secondary | ICD-10-CM | POA: Diagnosis not present

## 2017-04-05 ENCOUNTER — Encounter (INDEPENDENT_AMBULATORY_CARE_PROVIDER_SITE_OTHER): Payer: Self-pay | Admitting: *Deleted

## 2017-04-09 ENCOUNTER — Emergency Department (HOSPITAL_COMMUNITY)
Admission: EM | Admit: 2017-04-09 | Discharge: 2017-04-09 | Disposition: A | Discharge status: Home / Self Care | Payer: Medicare Other | Attending: Emergency Medicine | Admitting: Emergency Medicine

## 2017-04-09 ENCOUNTER — Emergency Department (HOSPITAL_COMMUNITY): Payer: Medicare Other

## 2017-04-09 ENCOUNTER — Encounter (HOSPITAL_COMMUNITY): Payer: Self-pay | Admitting: Cardiology

## 2017-04-09 DIAGNOSIS — E119 Type 2 diabetes mellitus without complications: Secondary | ICD-10-CM | POA: Diagnosis not present

## 2017-04-09 DIAGNOSIS — Z7982 Long term (current) use of aspirin: Secondary | ICD-10-CM | POA: Diagnosis not present

## 2017-04-09 DIAGNOSIS — Z87891 Personal history of nicotine dependence: Secondary | ICD-10-CM | POA: Diagnosis not present

## 2017-04-09 DIAGNOSIS — J40 Bronchitis, not specified as acute or chronic: Secondary | ICD-10-CM | POA: Diagnosis not present

## 2017-04-09 DIAGNOSIS — R05 Cough: Secondary | ICD-10-CM | POA: Diagnosis not present

## 2017-04-09 DIAGNOSIS — J209 Acute bronchitis, unspecified: Secondary | ICD-10-CM | POA: Diagnosis not present

## 2017-04-09 DIAGNOSIS — Z79899 Other long term (current) drug therapy: Secondary | ICD-10-CM | POA: Insufficient documentation

## 2017-04-09 DIAGNOSIS — Z794 Long term (current) use of insulin: Secondary | ICD-10-CM | POA: Insufficient documentation

## 2017-04-09 DIAGNOSIS — H578 Other specified disorders of eye and adnexa: Secondary | ICD-10-CM | POA: Diagnosis not present

## 2017-04-09 DIAGNOSIS — I10 Essential (primary) hypertension: Secondary | ICD-10-CM | POA: Insufficient documentation

## 2017-04-09 HISTORY — DX: Congenital malformation of eye, unspecified: Q15.9

## 2017-04-09 MED ORDER — GUAIFENESIN-CODEINE 100-10 MG/5ML PO SYRP: 10.0000 mL | ORAL_SOLUTION | Freq: Three times a day (TID) | ORAL | 0 refills | Status: DC | PRN

## 2017-04-09 MED ORDER — ALBUTEROL SULFATE HFA 108 (90 BASE) MCG/ACT IN AERS
2.0000 | INHALATION_SPRAY | Freq: Once | RESPIRATORY_TRACT | Status: AC
  Administered 2017-04-09: 2 via RESPIRATORY_TRACT
  Filled 2017-04-09: qty 6.7

## 2017-04-09 MED ORDER — TOBRAMYCIN 0.3 % OP SOLN
1.0000 [drp] | Freq: Once | OPHTHALMIC | Status: AC
  Administered 2017-04-09: 1 [drp] via OPHTHALMIC
  Filled 2017-04-09: qty 5

## 2017-04-09 MED ORDER — GUAIFENESIN-CODEINE 100-10 MG/5ML PO SOLN
10.0000 mL | Freq: Once | ORAL | Status: AC
  Administered 2017-04-09: 10 mL via ORAL
  Filled 2017-04-09: qty 10

## 2017-04-09 MED ORDER — DOXYCYCLINE HYCLATE 100 MG PO CAPS: 100.0000 mg | ORAL_CAPSULE | Freq: Two times a day (BID) | ORAL | 0 refills | Status: DC

## 2017-04-09 MED ORDER — ALBUTEROL SULFATE (2.5 MG/3ML) 0.083% IN NEBU
5.0000 mg | INHALATION_SOLUTION | Freq: Once | RESPIRATORY_TRACT | Status: AC
  Administered 2017-04-09: 5 mg via RESPIRATORY_TRACT
  Filled 2017-04-09: qty 6

## 2017-04-09 NOTE — ED Provider Notes (Signed)
[facility] DEPT Provider Note   CSN: [id number] Arrival date & time: 04/09/17  1345   By signing my name below, I, Marc Haynes, attest that this documentation has been prepared under the direction and in the presence of Ariany Kesselman, PA-C. Electronically Signed: Eunice Haynes, Scribe. 04/09/17. 6:05 PM.   History   Chief Complaint Chief Complaint  Patient presents with  . Cough   The history is provided by the patient and medical records. No language interpreter was used.    Marc Haynes is a 66 y.o. male with h/o IDDM, who presents to the Emergency Department with concern for gradually worsening cough with sputum progressing in color from green to brown. Associated frequent eye crusting, mild SOB on exertion, sneeze, eye redness, swelling, itching and swelling, nasal congestion progressing to the chest since onset noted. Robitussin, nyquil, allergy medications taken at home with inadequate relief. Pt has cleansed eyes and applied eye drops with no relief to eye crusting and drainage. Pt has exacerbated his eye redness at home by rubbing them. Pt taking prescribed z-pack and states he has 1 left. No noted relief from this intervention. Denies chest pain, vomiting, sore throat, or shortness of breath other than with coughing or exertion.  Past Medical History:  Diagnosis Date  . Diabetes mellitus without complication ([place])   . Eye abnormality    edema of eye .  has had laser surgeries.   . Hypertension   . Sleep apnea    does not use CPAP  . Stroke [facility]) 2011    Patient Active Problem List   Diagnosis Date Noted  . DIABETES 07/13/2009  . CALCANEAL FRACTURE 07/13/2009  . HIGH BLOOD PRESSURE 07/13/2009    Past Surgical History:  Procedure Laterality Date  . APPENDECTOMY    . CHOLECYSTECTOMY    . COLONOSCOPY    . EYE SURGERY Left    Vitrectomy, bilateral cataracts  . FRACTURE SURGERY Left    lower leg/ankle  . HEMORRHOID SURGERY  2007?  Marland Kitchen PARS PLANA VITRECTOMY  Right 12/10/2013   Procedure: PARS PLANA VITRECTOMY 25 GAUGE WITH ENDOLASER RIGHT EYE ;  Surgeon: [name], MD;  Location: [facility];  Service: Ophthalmology;  Laterality: Right;       Home Medications    Prior to Admission medications   Medication Sig Start Date End Date Taking? Authorizing Provider  aspirin 325 MG tablet Take 325 mg by mouth daily.    [provider]  clopidogrel (PLAVIX) 75 MG tablet Take 75 mg by mouth daily with breakfast.    [provider]  esomeprazole (NEXIUM) 40 MG capsule Take 40 mg by mouth daily at 12 noon.    [provider]  hydrochlorothiazide (MICROZIDE) 12.5 MG capsule Take 12.5 mg by mouth daily.    [provider]  insulin glargine (LANTUS) 100 UNIT/ML injection Inject 80 Units into the skin at bedtime.    [provider]  insulin lispro (HUMALOG) 100 UNIT/ML injection Inject 12-22 Units into the skin 3 (three) times daily before meals. Sliding scale    [provider]  losartan (COZAAR) 100 MG tablet Take 100 mg by mouth daily.    [provider]  metFORMIN (GLUCOPHAGE) 1000 MG tablet Take 4,000 mg by mouth at bedtime.    [provider]  Multiple Vitamin (MULTIVITAMIN WITH MINERALS) TABS tablet Take 1 tablet by mouth daily.    [provider]  omega-3 acid ethyl esters (LOVAZA) 1 G capsule Take 4,000 mg by  mouth daily.    [provider]  pioglitazone (ACTOS) 45 MG tablet Take 45 mg by mouth daily.    [provider]  rosuvastatin (CRESTOR) 20 MG tablet Take 20 mg by mouth daily.    [provider]  VICTOZA 18 MG/3ML SOPN Inject 1.6 mg into the skin daily. 12/02/13   [provider]    Family History History reviewed. No pertinent family history.  Social History Social History  Substance Use Topics  . Smoking status: Former Smoker    Types: Cigarettes    Quit date: 12/11/1987  . Smokeless tobacco: Never Used  . Alcohol use Yes      Comment: occasional     Allergies   Cortisone   Review of Systems Review of Systems  Constitutional: Negative for appetite change and fever.  HENT: Positive for congestion and rhinorrhea. Negative for ear pain and sore throat.   Eyes: Positive for discharge, redness and itching.  Respiratory: Positive for cough, chest tightness and shortness of breath.   Cardiovascular: Negative for chest pain.  Gastrointestinal: Negative for abdominal pain, nausea and vomiting.  Genitourinary: Negative for dysuria.  Musculoskeletal: Negative for myalgias.  Skin: Negative for rash.  Neurological: Negative for dizziness, weakness and numbness.  All other systems reviewed and are negative.    Physical Exam Updated Vital Signs BP (!) 175/58   Pulse (!) 105   Temp 98 F (36.7 C) (Oral)   Resp 20   Ht 5\' 11"  (1.803 m)   Wt 200 lb (90.7 kg)   SpO2 97%   BMI 27.89 kg/m   Physical Exam  Constitutional: He is oriented to person, place, and time. He appears well-developed and well-nourished. No distress.  HENT:  Head: Normocephalic.  Right Ear: Tympanic membrane and ear canal normal.  Left Ear: Tympanic membrane and ear canal normal.  Mouth/Throat: Uvula is midline, oropharynx is clear and moist and mucous membranes are normal. No tonsillar exudate.  Eyes: EOM and lids are normal. Pupils are equal, round, and reactive to light. Right eye exhibits chemosis and discharge. Left eye exhibits discharge. Right conjunctiva is injected. Left conjunctiva is injected.  Neck: Normal range of motion.  Pulmonary/Chest: Effort normal.  Course lungs sounds bilaterally, no rales, no wheezing  Abdominal: He exhibits no distension.  Musculoskeletal: Normal range of motion.  Neurological: He is alert and oriented to person, place, and time.  Psychiatric: He has a normal mood and affect.  Nursing note and vitals reviewed.    ED Treatments / Results  DIAGNOSTIC STUDIES: Oxygen Saturation is 97% on RA, NL  by my interpretation.    COORDINATION OF CARE: 5:56 PM-Discussed next steps with pt. Pt verbalized understanding and is agreeable with the plan. Will order and Rx medications. Pt prepared for d/c, advised of symptomatic care at home and return precautions.    Labs (all labs ordered are listed, but only abnormal results are displayed) Labs Reviewed - No data to display  EKG  EKG Interpretation None       Radiology Dg Chest 2 View  Result Date: 04/09/2017 CLINICAL DATA:  Cough and congestion for several days EXAM: CHEST  2 VIEW COMPARISON:  04/08/2015 FINDINGS: Cardiac shadow is within normal limits. Mild interstitial changes are noted bilaterally. No focal infiltrate or sizable effusion is seen. No acute bony abnormality is noted. IMPRESSION: No active cardiopulmonary disease. Electronically Signed   By: Inez Catalina M.D.   On: 04/09/2017 16:36    Procedures Procedures (including critical care  time)  Medications Ordered in ED Medications  albuterol (PROVENTIL) (2.5 MG/3ML) 0.083% nebulizer solution 5 mg (5 mg Nebulization Given 04/09/17 1833)  guaiFENesin-codeine 100-10 MG/5ML solution 10 mL (10 mLs Oral Given 04/09/17 1817)  tobramycin (TOBREX) 0.3 % ophthalmic solution 1 drop (1 drop Both Eyes Given 04/09/17 1820)  albuterol (PROVENTIL HFA;VENTOLIN HFA) 108 (90 Base) MCG/ACT inhaler 2 puff (2 puffs Inhalation Given 04/09/17 1914)     Initial Impression / Assessment and Plan / ED Course  I have reviewed the triage vital signs and the nursing notes.  Pertinent labs & imaging results that were available during my care of the patient were reviewed by me and considered in my medical decision making (see chart for details).     Pt non toxic appearing, vitals stable.  Lung sounds improved after neb.  Dispensed albuterol MDI and tobramycin ophtho drops.  Pt ambulatory w/o distress.  Appears stable for d/c.  Return precautions discussed.   Final Clinical Impressions(s) / ED Diagnoses     Final diagnoses:  Bronchitis    New Prescriptions New Prescriptions   No medications on file    I personally performed the services described in this documentation, which was scribed in my presence. The recorded information has been reviewed and is accurate.    Kem Parkinson, PA-C 04/14/17 1149    Milton Ferguson, MD 04/16/17 1454

## 2017-04-09 NOTE — Discharge Instructions (Signed)
1 drop of the tobramycin to each eye every 4 hrs. For 7 days.  1-2 puffs of the inhaler every 4-6 hrs as needed.  Drink plenty of water.  Start the antibiotic tomorrow.  Follow-up with your doctor or return here for any worsening symptoms

## 2017-04-09 NOTE — ED Triage Notes (Signed)
Chest  And head congestion  Since Thursday.  Bilateral eye redness

## 2017-04-16 DIAGNOSIS — J40 Bronchitis, not specified as acute or chronic: Secondary | ICD-10-CM | POA: Diagnosis not present

## 2017-04-16 DIAGNOSIS — K219 Gastro-esophageal reflux disease without esophagitis: Secondary | ICD-10-CM | POA: Diagnosis not present

## 2017-04-16 DIAGNOSIS — I10 Essential (primary) hypertension: Secondary | ICD-10-CM | POA: Diagnosis not present

## 2017-04-16 DIAGNOSIS — Z6829 Body mass index (BMI) 29.0-29.9, adult: Secondary | ICD-10-CM | POA: Diagnosis not present

## 2017-04-16 DIAGNOSIS — I639 Cerebral infarction, unspecified: Secondary | ICD-10-CM | POA: Diagnosis not present

## 2017-04-16 DIAGNOSIS — E1165 Type 2 diabetes mellitus with hyperglycemia: Secondary | ICD-10-CM | POA: Diagnosis not present

## 2017-04-16 DIAGNOSIS — E11319 Type 2 diabetes mellitus with unspecified diabetic retinopathy without macular edema: Secondary | ICD-10-CM | POA: Diagnosis not present

## 2017-04-16 DIAGNOSIS — Z299 Encounter for prophylactic measures, unspecified: Secondary | ICD-10-CM | POA: Diagnosis not present

## 2017-04-16 DIAGNOSIS — E7800 Pure hypercholesterolemia, unspecified: Secondary | ICD-10-CM | POA: Diagnosis not present

## 2017-04-20 DIAGNOSIS — Z299 Encounter for prophylactic measures, unspecified: Secondary | ICD-10-CM | POA: Diagnosis not present

## 2017-04-20 DIAGNOSIS — E1165 Type 2 diabetes mellitus with hyperglycemia: Secondary | ICD-10-CM | POA: Diagnosis not present

## 2017-04-20 DIAGNOSIS — I639 Cerebral infarction, unspecified: Secondary | ICD-10-CM | POA: Diagnosis not present

## 2017-04-20 DIAGNOSIS — J209 Acute bronchitis, unspecified: Secondary | ICD-10-CM | POA: Diagnosis not present

## 2017-04-20 DIAGNOSIS — Z6829 Body mass index (BMI) 29.0-29.9, adult: Secondary | ICD-10-CM | POA: Diagnosis not present

## 2017-04-20 DIAGNOSIS — K219 Gastro-esophageal reflux disease without esophagitis: Secondary | ICD-10-CM | POA: Diagnosis not present

## 2017-04-20 DIAGNOSIS — I10 Essential (primary) hypertension: Secondary | ICD-10-CM | POA: Diagnosis not present

## 2017-04-20 DIAGNOSIS — Z713 Dietary counseling and surveillance: Secondary | ICD-10-CM | POA: Diagnosis not present

## 2017-05-07 DIAGNOSIS — E782 Mixed hyperlipidemia: Secondary | ICD-10-CM | POA: Diagnosis not present

## 2017-05-07 DIAGNOSIS — E1165 Type 2 diabetes mellitus with hyperglycemia: Secondary | ICD-10-CM | POA: Diagnosis not present

## 2017-05-07 DIAGNOSIS — Z794 Long term (current) use of insulin: Secondary | ICD-10-CM | POA: Diagnosis not present

## 2017-05-10 DIAGNOSIS — Z794 Long term (current) use of insulin: Secondary | ICD-10-CM | POA: Diagnosis not present

## 2017-05-10 DIAGNOSIS — E782 Mixed hyperlipidemia: Secondary | ICD-10-CM | POA: Diagnosis not present

## 2017-05-10 DIAGNOSIS — Z833 Family history of diabetes mellitus: Secondary | ICD-10-CM | POA: Diagnosis not present

## 2017-05-10 DIAGNOSIS — I10 Essential (primary) hypertension: Secondary | ICD-10-CM | POA: Diagnosis not present

## 2017-05-10 DIAGNOSIS — E1165 Type 2 diabetes mellitus with hyperglycemia: Secondary | ICD-10-CM | POA: Diagnosis not present

## 2017-05-10 DIAGNOSIS — E538 Deficiency of other specified B group vitamins: Secondary | ICD-10-CM | POA: Diagnosis not present

## 2017-05-10 DIAGNOSIS — E113513 Type 2 diabetes mellitus with proliferative diabetic retinopathy with macular edema, bilateral: Secondary | ICD-10-CM | POA: Diagnosis not present

## 2017-05-10 DIAGNOSIS — E783 Hyperchylomicronemia: Secondary | ICD-10-CM | POA: Diagnosis not present

## 2017-05-16 DIAGNOSIS — Z79899 Other long term (current) drug therapy: Secondary | ICD-10-CM | POA: Diagnosis not present

## 2017-05-16 DIAGNOSIS — Z7189 Other specified counseling: Secondary | ICD-10-CM | POA: Diagnosis not present

## 2017-05-16 DIAGNOSIS — E7800 Pure hypercholesterolemia, unspecified: Secondary | ICD-10-CM | POA: Diagnosis not present

## 2017-05-16 DIAGNOSIS — Z299 Encounter for prophylactic measures, unspecified: Secondary | ICD-10-CM | POA: Diagnosis not present

## 2017-05-16 DIAGNOSIS — I10 Essential (primary) hypertension: Secondary | ICD-10-CM | POA: Diagnosis not present

## 2017-05-16 DIAGNOSIS — Z1389 Encounter for screening for other disorder: Secondary | ICD-10-CM | POA: Diagnosis not present

## 2017-05-16 DIAGNOSIS — Z125 Encounter for screening for malignant neoplasm of prostate: Secondary | ICD-10-CM | POA: Diagnosis not present

## 2017-05-16 DIAGNOSIS — Z6829 Body mass index (BMI) 29.0-29.9, adult: Secondary | ICD-10-CM | POA: Diagnosis not present

## 2017-05-16 DIAGNOSIS — E1165 Type 2 diabetes mellitus with hyperglycemia: Secondary | ICD-10-CM | POA: Diagnosis not present

## 2017-05-16 DIAGNOSIS — R9431 Abnormal electrocardiogram [ECG] [EKG]: Secondary | ICD-10-CM | POA: Diagnosis not present

## 2017-05-16 DIAGNOSIS — Z0000 Encounter for general adult medical examination without abnormal findings: Secondary | ICD-10-CM | POA: Diagnosis not present

## 2017-05-25 ENCOUNTER — Encounter: Payer: Self-pay | Admitting: *Deleted

## 2017-05-25 ENCOUNTER — Encounter: Payer: Self-pay | Admitting: Cardiovascular Disease

## 2017-05-25 ENCOUNTER — Ambulatory Visit (INDEPENDENT_AMBULATORY_CARE_PROVIDER_SITE_OTHER): Payer: Medicare Other | Admitting: Cardiovascular Disease

## 2017-05-25 VITALS — BP 148/74 | HR 65 | Ht 72.0 in | Wt 204.0 lb

## 2017-05-25 DIAGNOSIS — R9431 Abnormal electrocardiogram [ECG] [EKG]: Secondary | ICD-10-CM | POA: Diagnosis not present

## 2017-05-25 DIAGNOSIS — E785 Hyperlipidemia, unspecified: Secondary | ICD-10-CM | POA: Diagnosis not present

## 2017-05-25 DIAGNOSIS — I4510 Unspecified right bundle-branch block: Secondary | ICD-10-CM

## 2017-05-25 DIAGNOSIS — I10 Essential (primary) hypertension: Secondary | ICD-10-CM

## 2017-05-25 DIAGNOSIS — Z713 Dietary counseling and surveillance: Secondary | ICD-10-CM | POA: Diagnosis not present

## 2017-05-25 DIAGNOSIS — I440 Atrioventricular block, first degree: Secondary | ICD-10-CM | POA: Diagnosis not present

## 2017-05-25 NOTE — Patient Instructions (Signed)
Medication Instructions:  Continue all current medications.  Labwork: none  Testing/Procedures: none  Follow-Up: As needed.    Any Other Special Instructions Will Be Listed Below (If Applicable).  If you need a refill on your cardiac medications before your next appointment, please call your pharmacy.  

## 2017-05-25 NOTE — Progress Notes (Signed)
CARDIOLOGY CONSULT NOTE  Patient ID: Marc Haynes MRN: [id number] DOB/AGE: 1951-06-19 66 y.o.  Admit date: (Not on file) Primary Physician: Jerene Bears MD Referring Physician: Jerene Bears  Reason for Consultation: abnormal ECG  HPI: Marc Haynes is a 66 y.o. male who is being seen today for the evaluation of abnormal ECG at the request of Jerene Bears MD.  The patient has a history of hypertension, hyperlipidemia, insulin-dependent diabetes mellitus, and stroke.  I personally interpreted ECG performed at her PCPs office on 05/16/17 which demonstrated sinus tachycardia, 104 bpm, right bundle branch block, QRS 154 ms, first-degree AV block, PR 224 ms, nonspecific ST segment abnormalities.  I reviewed labs performed on 05/16/17 which showed hemoglobin 16.5, platelet 149, white blood cells 9.1. Additional labs performed on 05/07/17 showed creatinine 0.4, BUN 14, total cholesterol 67, triglycerides 113, HDL 45, LDL 45.  I personally reviewed an ECG performed on 04/08/15 which also demonstrated sinus rhythm with right bundle branch block and first-degree AV block.  He underwent a stress echocardiogram in 2016 but I do not have these results. He was evaluated by cardiology at that time.  He tells me he had a stroke without residual deficits in 1988. He had been on aspirin at that time and Plavix was subsequently added.  He tells me his stress test was "normal". He had chest pain at that time blamed on Invokana. Symptoms resolved with cessation of this medication.   He was diagnosed with diabetes at the age of 61. He quit smoking 30 years ago.  He is a retired Engineer, structural. 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  The patient denies any symptoms of chest pain, palpitations, shortness of breath, lightheadedness, dizziness, leg swelling, orthopnea, PND, and syncope.  He said he eats a lot of salt. He monitors his blood pressure at  home with systolic readings in the 891 range usually.  He sustained a right leg injury when he slipped on antifreeze several years ago. He has difficulty walking due to this.  ECG performed in the office today which I ordered and personally interpreted demonstrated sinus rhythm with first-degree AV block, PR 230 ms, right bundle branch block with right axis deviation, QRS 144 ms.   Allergies  Allergen Reactions  . Cortisone     Current Outpatient Prescriptions  Medication Sig Dispense Refill  . aspirin EC 81 MG tablet Take 81 mg by mouth daily.    . clopidogrel (PLAVIX) 75 MG tablet Take 75 mg by mouth daily with breakfast.    . diltiazem (TIAZAC) 240 MG 24 hr capsule Take 240 mg by mouth daily.    Marland Kitchen esomeprazole (NEXIUM) 40 MG capsule Take 40 mg by mouth daily at 12 noon.    . ezetimibe (ZETIA) 10 MG tablet Take 10 mg by mouth daily.    Marland Kitchen guaiFENesin-codeine (ROBITUSSIN AC) 100-10 MG/5ML syrup Take 10 mLs by mouth 3 (three) times daily as needed. 120 mL 0  . hydrochlorothiazide (MICROZIDE) 12.5 MG capsule Take 12.5 mg by mouth daily.    [name] Ethyl (VASCEPA) 1 g CAPS Take by mouth 2 (two) times daily.    . Insulin Degludec (TRESIBA FLEXTOUCH) 200 UNIT/ML SOPN Inject into the skin.    Marland Kitchen insulin glargine (LANTUS) 100 UNIT/ML injection Inject 80 Units into the skin at bedtime.    . insulin lispro (HUMALOG) 100 UNIT/ML injection Inject 12-22 Units into the  skin 3 (three) times daily before meals. Sliding scale    . losartan (COZAAR) 100 MG tablet Take 100 mg by mouth daily.    . metFORMIN (GLUCOPHAGE) 1000 MG tablet Take 4,000 mg by mouth at bedtime.    . Multiple Vitamin (MULTIVITAMIN WITH MINERALS) TABS tablet Take 1 tablet by mouth daily.    Marland Kitchen omega-3 acid ethyl esters (LOVAZA) 1 G capsule Take 4,000 mg by mouth daily.    . rosuvastatin (CRESTOR) 20 MG tablet Take 20 mg by mouth daily.    Marland Kitchen VICTOZA 18 MG/3ML SOPN Inject 1.6 mg into the skin daily.     No current  facility-administered medications for this visit.     Past Medical History:  Diagnosis Date  . Diabetes mellitus without complication ([place])   . Eye abnormality    edema of eye .  has had laser surgeries.   . Hypertension   . Sleep apnea    does not use CPAP  . Stroke [facility]) 2011    Past Surgical History:  Procedure Laterality Date  . APPENDECTOMY    . CHOLECYSTECTOMY    . COLONOSCOPY    . EYE SURGERY Left    Vitrectomy, bilateral cataracts  . FRACTURE SURGERY Left    lower leg/ankle  . HEMORRHOID SURGERY  2007?  Marland Kitchen PARS PLANA VITRECTOMY Right 12/10/2013   Procedure: PARS PLANA VITRECTOMY 25 GAUGE WITH ENDOLASER RIGHT EYE ;  Surgeon: [name], MD;  Location: [facility];  Service: Ophthalmology;  Laterality: Right;    Social History   Social History  . Marital status: Married    Spouse name: N/A  . Number of children: N/A  . Years of education: N/A   Occupational History  . Not on file.   Social History Main Topics  . Smoking status: Former Smoker    Types: Cigarettes    Quit date: 12/11/1987  . Smokeless tobacco: Never Used  . Alcohol use Yes     Comment: occasional  . Drug use: No  . Sexual activity: Not on file   Other Topics Concern  . Not on file   Social History Narrative  . No narrative on file     No family history of premature CAD in 1st degree relatives.  Current Meds  Medication Sig  . aspirin EC 81 MG tablet Take 81 mg by mouth daily.  . clopidogrel (PLAVIX) 75 MG tablet Take 75 mg by mouth daily with breakfast.  . diltiazem (TIAZAC) 240 MG 24 hr capsule Take 240 mg by mouth daily.  Marland Kitchen esomeprazole (NEXIUM) 40 MG capsule Take 40 mg by mouth daily at 12 noon.  . ezetimibe (ZETIA) 10 MG tablet Take 10 mg by mouth daily.  Marland Kitchen guaiFENesin-codeine (ROBITUSSIN AC) 100-10 MG/5ML syrup Take 10 mLs by mouth 3 (three) times daily as needed.  . hydrochlorothiazide (MICROZIDE) 12.5 MG capsule Take 12.5 mg by mouth daily.  [name] Ethyl (VASCEPA) 1 g CAPS  Take by mouth 2 (two) times daily.  . Insulin Degludec (TRESIBA FLEXTOUCH) 200 UNIT/ML SOPN Inject into the skin.  Marland Kitchen insulin glargine (LANTUS) 100 UNIT/ML injection Inject 80 Units into the skin at bedtime.  . insulin lispro (HUMALOG) 100 UNIT/ML injection Inject 12-22 Units into the skin 3 (three) times daily before meals. Sliding scale  . losartan (COZAAR) 100 MG tablet Take 100 mg by mouth daily.  . metFORMIN (GLUCOPHAGE) 1000 MG tablet Take 4,000 mg by mouth at bedtime.  . Multiple Vitamin (MULTIVITAMIN WITH MINERALS) TABS  tablet Take 1 tablet by mouth daily.  Marland Kitchen omega-3 acid ethyl esters (LOVAZA) 1 G capsule Take 4,000 mg by mouth daily.  . rosuvastatin (CRESTOR) 20 MG tablet Take 20 mg by mouth daily.  Marland Kitchen VICTOZA 18 MG/3ML SOPN Inject 1.6 mg into the skin daily.  . [DISCONTINUED] pioglitazone (ACTOS) 45 MG tablet Take 45 mg by mouth daily.      Review of systems complete and found to be negative unless listed above in HPI    Physical exam Blood pressure (!) 148/74, pulse 65, height 6' (1.829 m), weight 204 lb (92.5 kg), SpO2 98 %. General: NAD Neck: No JVD, no thyromegaly or thyroid nodule.  Lungs: Clear to auscultation bilaterally with normal respiratory effort. CV: Nondisplaced PMI. Regular rate and rhythm, normal S1/S2, no S3/S4, no murmur.  No peripheral edema.  No carotid bruit.    Abdomen: Soft, nontender, no distention.  Neurologic: Alert and oriented x 3.  Psych: Normal affect. Extremities: No clubbing or cyanosis.  HEENT: Normal.   ECG: Most recent ECG reviewed.   Labs: Lab Results  Component Value Date/Time   K 4.1 12/10/2013 11:46 AM   BUN 23 12/10/2013 11:46 AM   CREATININE 0.92 12/10/2013 11:46 AM   HGB 16.3 12/10/2013 11:46 AM     Lipids: No results found for: LDLCALC, LDLDIRECT, CHOL, TRIG, HDL      ASSESSMENT AND PLAN:  1. Abnormal ECG with right bundle-branch block and first-degree AV block: He is entirely asymptomatic. I told him should he develop  chest pain, shortness of breath, palpitations, lightheadedness, or dizziness, to contact me. At this time no cardiac testing is indicated. I spoke to him at length about primary prevention of coronary artery disease and blood pressure control. I advocated a low-sodium diet.  2. Hypertension: Blood pressure is elevated and appears to be elevated at home. Given his history of diabetes and stroke, blood pressure should ideally be 130/80 or less. I educated him on the importance of a low sodium diet. He is on losartan 100 mg, HCTZ 12.5, and diltiazem.  3. Hyperlipidemia: Continue Crestor.  Disposition: Follow up prn.   Signed: Kate Sable, M.D., F.A.C.C.  05/25/2017, 2:17 PM

## 2017-06-05 ENCOUNTER — Ambulatory Visit: Payer: Medicare Other | Admitting: Cardiovascular Disease

## 2017-08-10 DIAGNOSIS — E782 Mixed hyperlipidemia: Secondary | ICD-10-CM | POA: Diagnosis not present

## 2017-08-10 DIAGNOSIS — E1165 Type 2 diabetes mellitus with hyperglycemia: Secondary | ICD-10-CM | POA: Diagnosis not present

## 2017-08-10 DIAGNOSIS — Z794 Long term (current) use of insulin: Secondary | ICD-10-CM | POA: Diagnosis not present

## 2017-08-16 DIAGNOSIS — Z6829 Body mass index (BMI) 29.0-29.9, adult: Secondary | ICD-10-CM | POA: Diagnosis not present

## 2017-08-16 DIAGNOSIS — E7800 Pure hypercholesterolemia, unspecified: Secondary | ICD-10-CM | POA: Diagnosis not present

## 2017-08-16 DIAGNOSIS — I10 Essential (primary) hypertension: Secondary | ICD-10-CM | POA: Diagnosis not present

## 2017-08-16 DIAGNOSIS — Z299 Encounter for prophylactic measures, unspecified: Secondary | ICD-10-CM | POA: Diagnosis not present

## 2017-08-16 DIAGNOSIS — E1165 Type 2 diabetes mellitus with hyperglycemia: Secondary | ICD-10-CM | POA: Diagnosis not present

## 2017-08-20 DIAGNOSIS — I10 Essential (primary) hypertension: Secondary | ICD-10-CM | POA: Diagnosis not present

## 2017-08-20 DIAGNOSIS — E783 Hyperchylomicronemia: Secondary | ICD-10-CM | POA: Diagnosis not present

## 2017-08-20 DIAGNOSIS — Z794 Long term (current) use of insulin: Secondary | ICD-10-CM | POA: Diagnosis not present

## 2017-08-20 DIAGNOSIS — E538 Deficiency of other specified B group vitamins: Secondary | ICD-10-CM | POA: Diagnosis not present

## 2017-08-20 DIAGNOSIS — E782 Mixed hyperlipidemia: Secondary | ICD-10-CM | POA: Diagnosis not present

## 2017-08-20 DIAGNOSIS — E1165 Type 2 diabetes mellitus with hyperglycemia: Secondary | ICD-10-CM | POA: Diagnosis not present

## 2017-08-20 DIAGNOSIS — Z23 Encounter for immunization: Secondary | ICD-10-CM | POA: Diagnosis not present

## 2017-08-20 DIAGNOSIS — E113513 Type 2 diabetes mellitus with proliferative diabetic retinopathy with macular edema, bilateral: Secondary | ICD-10-CM | POA: Diagnosis not present

## 2017-09-04 DIAGNOSIS — E782 Mixed hyperlipidemia: Secondary | ICD-10-CM | POA: Diagnosis not present

## 2017-09-04 DIAGNOSIS — I10 Essential (primary) hypertension: Secondary | ICD-10-CM | POA: Diagnosis not present

## 2017-09-04 DIAGNOSIS — E113513 Type 2 diabetes mellitus with proliferative diabetic retinopathy with macular edema, bilateral: Secondary | ICD-10-CM | POA: Diagnosis not present

## 2017-09-04 DIAGNOSIS — E538 Deficiency of other specified B group vitamins: Secondary | ICD-10-CM | POA: Diagnosis not present

## 2017-09-04 DIAGNOSIS — E1165 Type 2 diabetes mellitus with hyperglycemia: Secondary | ICD-10-CM | POA: Diagnosis not present

## 2017-09-04 DIAGNOSIS — E783 Hyperchylomicronemia: Secondary | ICD-10-CM | POA: Diagnosis not present

## 2017-09-04 DIAGNOSIS — Z794 Long term (current) use of insulin: Secondary | ICD-10-CM | POA: Diagnosis not present

## 2017-09-19 DIAGNOSIS — H31009 Unspecified chorioretinal scars, unspecified eye: Secondary | ICD-10-CM | POA: Diagnosis not present

## 2017-09-19 DIAGNOSIS — E103553 Type 1 diabetes mellitus with stable proliferative diabetic retinopathy, bilateral: Secondary | ICD-10-CM | POA: Diagnosis not present

## 2017-09-19 DIAGNOSIS — H3563 Retinal hemorrhage, bilateral: Secondary | ICD-10-CM | POA: Diagnosis not present

## 2017-09-19 DIAGNOSIS — H35043 Retinal micro-aneurysms, unspecified, bilateral: Secondary | ICD-10-CM | POA: Diagnosis not present

## 2017-11-28 DIAGNOSIS — E1165 Type 2 diabetes mellitus with hyperglycemia: Secondary | ICD-10-CM | POA: Diagnosis not present

## 2017-11-28 DIAGNOSIS — E782 Mixed hyperlipidemia: Secondary | ICD-10-CM | POA: Diagnosis not present

## 2017-11-28 DIAGNOSIS — E538 Deficiency of other specified B group vitamins: Secondary | ICD-10-CM | POA: Diagnosis not present

## 2017-11-28 DIAGNOSIS — Z794 Long term (current) use of insulin: Secondary | ICD-10-CM | POA: Diagnosis not present

## 2017-12-03 DIAGNOSIS — E113513 Type 2 diabetes mellitus with proliferative diabetic retinopathy with macular edema, bilateral: Secondary | ICD-10-CM | POA: Diagnosis not present

## 2017-12-03 DIAGNOSIS — E1165 Type 2 diabetes mellitus with hyperglycemia: Secondary | ICD-10-CM | POA: Diagnosis not present

## 2017-12-03 DIAGNOSIS — E783 Hyperchylomicronemia: Secondary | ICD-10-CM | POA: Diagnosis not present

## 2017-12-03 DIAGNOSIS — I10 Essential (primary) hypertension: Secondary | ICD-10-CM | POA: Diagnosis not present

## 2017-12-03 DIAGNOSIS — Z8673 Personal history of transient ischemic attack (TIA), and cerebral infarction without residual deficits: Secondary | ICD-10-CM | POA: Diagnosis not present

## 2017-12-03 DIAGNOSIS — G4700 Insomnia, unspecified: Secondary | ICD-10-CM | POA: Diagnosis not present

## 2017-12-03 DIAGNOSIS — Z823 Family history of stroke: Secondary | ICD-10-CM | POA: Diagnosis not present

## 2017-12-03 DIAGNOSIS — R809 Proteinuria, unspecified: Secondary | ICD-10-CM | POA: Diagnosis not present

## 2017-12-03 DIAGNOSIS — E538 Deficiency of other specified B group vitamins: Secondary | ICD-10-CM | POA: Diagnosis not present

## 2017-12-03 DIAGNOSIS — Z794 Long term (current) use of insulin: Secondary | ICD-10-CM | POA: Diagnosis not present

## 2017-12-03 DIAGNOSIS — E782 Mixed hyperlipidemia: Secondary | ICD-10-CM | POA: Diagnosis not present

## 2017-12-03 DIAGNOSIS — Z833 Family history of diabetes mellitus: Secondary | ICD-10-CM | POA: Diagnosis not present

## 2018-02-18 DIAGNOSIS — E1165 Type 2 diabetes mellitus with hyperglycemia: Secondary | ICD-10-CM | POA: Diagnosis not present

## 2018-02-18 DIAGNOSIS — Z794 Long term (current) use of insulin: Secondary | ICD-10-CM | POA: Diagnosis not present

## 2018-02-18 DIAGNOSIS — E782 Mixed hyperlipidemia: Secondary | ICD-10-CM | POA: Diagnosis not present

## 2018-03-04 DIAGNOSIS — Z299 Encounter for prophylactic measures, unspecified: Secondary | ICD-10-CM | POA: Diagnosis not present

## 2018-03-04 DIAGNOSIS — F5221 Male erectile disorder: Secondary | ICD-10-CM | POA: Diagnosis not present

## 2018-03-04 DIAGNOSIS — I10 Essential (primary) hypertension: Secondary | ICD-10-CM | POA: Diagnosis not present

## 2018-03-04 DIAGNOSIS — G4730 Sleep apnea, unspecified: Secondary | ICD-10-CM | POA: Diagnosis not present

## 2018-03-04 DIAGNOSIS — Z6829 Body mass index (BMI) 29.0-29.9, adult: Secondary | ICD-10-CM | POA: Diagnosis not present

## 2018-03-04 DIAGNOSIS — F321 Major depressive disorder, single episode, moderate: Secondary | ICD-10-CM | POA: Diagnosis not present

## 2018-03-04 DIAGNOSIS — E538 Deficiency of other specified B group vitamins: Secondary | ICD-10-CM | POA: Diagnosis not present

## 2018-03-04 DIAGNOSIS — N529 Male erectile dysfunction, unspecified: Secondary | ICD-10-CM | POA: Diagnosis not present

## 2018-03-04 DIAGNOSIS — E1165 Type 2 diabetes mellitus with hyperglycemia: Secondary | ICD-10-CM | POA: Diagnosis not present

## 2018-03-04 DIAGNOSIS — Z8673 Personal history of transient ischemic attack (TIA), and cerebral infarction without residual deficits: Secondary | ICD-10-CM | POA: Diagnosis not present

## 2018-03-04 DIAGNOSIS — E782 Mixed hyperlipidemia: Secondary | ICD-10-CM | POA: Diagnosis not present

## 2018-03-04 DIAGNOSIS — G4700 Insomnia, unspecified: Secondary | ICD-10-CM | POA: Diagnosis not present

## 2018-03-04 DIAGNOSIS — Z794 Long term (current) use of insulin: Secondary | ICD-10-CM | POA: Diagnosis not present

## 2018-03-04 DIAGNOSIS — M791 Myalgia, unspecified site: Secondary | ICD-10-CM | POA: Diagnosis not present

## 2018-03-04 DIAGNOSIS — E7800 Pure hypercholesterolemia, unspecified: Secondary | ICD-10-CM | POA: Diagnosis not present

## 2018-03-04 DIAGNOSIS — E113213 Type 2 diabetes mellitus with mild nonproliferative diabetic retinopathy with macular edema, bilateral: Secondary | ICD-10-CM | POA: Diagnosis not present

## 2018-03-04 DIAGNOSIS — E11319 Type 2 diabetes mellitus with unspecified diabetic retinopathy without macular edema: Secondary | ICD-10-CM | POA: Diagnosis not present

## 2018-03-04 DIAGNOSIS — E783 Hyperchylomicronemia: Secondary | ICD-10-CM | POA: Diagnosis not present

## 2018-03-04 DIAGNOSIS — R809 Proteinuria, unspecified: Secondary | ICD-10-CM | POA: Diagnosis not present

## 2018-04-11 DIAGNOSIS — S61215A Laceration without foreign body of left ring finger without damage to nail, initial encounter: Secondary | ICD-10-CM | POA: Diagnosis not present

## 2018-04-11 DIAGNOSIS — Z23 Encounter for immunization: Secondary | ICD-10-CM | POA: Diagnosis not present

## 2018-05-23 DIAGNOSIS — Z0000 Encounter for general adult medical examination without abnormal findings: Secondary | ICD-10-CM | POA: Diagnosis not present

## 2018-05-23 DIAGNOSIS — F321 Major depressive disorder, single episode, moderate: Secondary | ICD-10-CM | POA: Diagnosis not present

## 2018-05-23 DIAGNOSIS — Z299 Encounter for prophylactic measures, unspecified: Secondary | ICD-10-CM | POA: Diagnosis not present

## 2018-05-23 DIAGNOSIS — Z7189 Other specified counseling: Secondary | ICD-10-CM | POA: Diagnosis not present

## 2018-05-23 DIAGNOSIS — Z1211 Encounter for screening for malignant neoplasm of colon: Secondary | ICD-10-CM | POA: Diagnosis not present

## 2018-05-23 DIAGNOSIS — I10 Essential (primary) hypertension: Secondary | ICD-10-CM | POA: Diagnosis not present

## 2018-05-23 DIAGNOSIS — Z1339 Encounter for screening examination for other mental health and behavioral disorders: Secondary | ICD-10-CM | POA: Diagnosis not present

## 2018-05-23 DIAGNOSIS — Z1331 Encounter for screening for depression: Secondary | ICD-10-CM | POA: Diagnosis not present

## 2018-05-23 DIAGNOSIS — Z125 Encounter for screening for malignant neoplasm of prostate: Secondary | ICD-10-CM | POA: Diagnosis not present

## 2018-05-23 DIAGNOSIS — Z6829 Body mass index (BMI) 29.0-29.9, adult: Secondary | ICD-10-CM | POA: Diagnosis not present

## 2018-05-23 DIAGNOSIS — Z87891 Personal history of nicotine dependence: Secondary | ICD-10-CM | POA: Diagnosis not present

## 2018-05-27 ENCOUNTER — Encounter (INDEPENDENT_AMBULATORY_CARE_PROVIDER_SITE_OTHER): Payer: Self-pay | Admitting: *Deleted

## 2018-06-03 DIAGNOSIS — E782 Mixed hyperlipidemia: Secondary | ICD-10-CM | POA: Diagnosis not present

## 2018-06-03 DIAGNOSIS — E1165 Type 2 diabetes mellitus with hyperglycemia: Secondary | ICD-10-CM | POA: Diagnosis not present

## 2018-06-03 DIAGNOSIS — Z794 Long term (current) use of insulin: Secondary | ICD-10-CM | POA: Diagnosis not present

## 2018-06-08 DIAGNOSIS — Z7902 Long term (current) use of antithrombotics/antiplatelets: Secondary | ICD-10-CM | POA: Diagnosis not present

## 2018-06-08 DIAGNOSIS — Z794 Long term (current) use of insulin: Secondary | ICD-10-CM | POA: Diagnosis not present

## 2018-06-08 DIAGNOSIS — E785 Hyperlipidemia, unspecified: Secondary | ICD-10-CM | POA: Diagnosis not present

## 2018-06-08 DIAGNOSIS — Z87891 Personal history of nicotine dependence: Secondary | ICD-10-CM | POA: Diagnosis not present

## 2018-06-08 DIAGNOSIS — K219 Gastro-esophageal reflux disease without esophagitis: Secondary | ICD-10-CM | POA: Diagnosis not present

## 2018-06-08 DIAGNOSIS — S2232XA Fracture of one rib, left side, initial encounter for closed fracture: Secondary | ICD-10-CM | POA: Diagnosis not present

## 2018-06-08 DIAGNOSIS — Z8673 Personal history of transient ischemic attack (TIA), and cerebral infarction without residual deficits: Secondary | ICD-10-CM | POA: Diagnosis not present

## 2018-06-08 DIAGNOSIS — W1839XA Other fall on same level, initial encounter: Secondary | ICD-10-CM | POA: Diagnosis not present

## 2018-06-08 DIAGNOSIS — I10 Essential (primary) hypertension: Secondary | ICD-10-CM | POA: Diagnosis not present

## 2018-06-08 DIAGNOSIS — E119 Type 2 diabetes mellitus without complications: Secondary | ICD-10-CM | POA: Diagnosis not present

## 2018-06-08 DIAGNOSIS — Z79899 Other long term (current) drug therapy: Secondary | ICD-10-CM | POA: Diagnosis not present

## 2018-06-08 DIAGNOSIS — M533 Sacrococcygeal disorders, not elsewhere classified: Secondary | ICD-10-CM | POA: Diagnosis not present

## 2018-06-13 DIAGNOSIS — G4700 Insomnia, unspecified: Secondary | ICD-10-CM | POA: Diagnosis not present

## 2018-06-13 DIAGNOSIS — N529 Male erectile dysfunction, unspecified: Secondary | ICD-10-CM | POA: Diagnosis not present

## 2018-06-13 DIAGNOSIS — Z8673 Personal history of transient ischemic attack (TIA), and cerebral infarction without residual deficits: Secondary | ICD-10-CM | POA: Diagnosis not present

## 2018-06-13 DIAGNOSIS — Z7982 Long term (current) use of aspirin: Secondary | ICD-10-CM | POA: Diagnosis not present

## 2018-06-13 DIAGNOSIS — E782 Mixed hyperlipidemia: Secondary | ICD-10-CM | POA: Diagnosis not present

## 2018-06-13 DIAGNOSIS — E113513 Type 2 diabetes mellitus with proliferative diabetic retinopathy with macular edema, bilateral: Secondary | ICD-10-CM | POA: Diagnosis not present

## 2018-06-13 DIAGNOSIS — R809 Proteinuria, unspecified: Secondary | ICD-10-CM | POA: Diagnosis not present

## 2018-06-13 DIAGNOSIS — I10 Essential (primary) hypertension: Secondary | ICD-10-CM | POA: Diagnosis not present

## 2018-06-13 DIAGNOSIS — E538 Deficiency of other specified B group vitamins: Secondary | ICD-10-CM | POA: Diagnosis not present

## 2018-06-13 DIAGNOSIS — Z794 Long term (current) use of insulin: Secondary | ICD-10-CM | POA: Diagnosis not present

## 2018-06-13 DIAGNOSIS — E783 Hyperchylomicronemia: Secondary | ICD-10-CM | POA: Diagnosis not present

## 2018-06-13 DIAGNOSIS — E1165 Type 2 diabetes mellitus with hyperglycemia: Secondary | ICD-10-CM | POA: Diagnosis not present

## 2018-06-26 DIAGNOSIS — H31009 Unspecified chorioretinal scars, unspecified eye: Secondary | ICD-10-CM | POA: Diagnosis not present

## 2018-06-26 DIAGNOSIS — H3563 Retinal hemorrhage, bilateral: Secondary | ICD-10-CM | POA: Diagnosis not present

## 2018-06-26 DIAGNOSIS — E103553 Type 1 diabetes mellitus with stable proliferative diabetic retinopathy, bilateral: Secondary | ICD-10-CM | POA: Diagnosis not present

## 2018-06-26 DIAGNOSIS — H35043 Retinal micro-aneurysms, unspecified, bilateral: Secondary | ICD-10-CM | POA: Diagnosis not present

## 2018-09-02 DIAGNOSIS — Z794 Long term (current) use of insulin: Secondary | ICD-10-CM | POA: Diagnosis not present

## 2018-09-02 DIAGNOSIS — E1165 Type 2 diabetes mellitus with hyperglycemia: Secondary | ICD-10-CM | POA: Diagnosis not present

## 2018-09-02 DIAGNOSIS — E782 Mixed hyperlipidemia: Secondary | ICD-10-CM | POA: Diagnosis not present

## 2018-09-12 DIAGNOSIS — I10 Essential (primary) hypertension: Secondary | ICD-10-CM | POA: Diagnosis not present

## 2018-09-12 DIAGNOSIS — E1165 Type 2 diabetes mellitus with hyperglycemia: Secondary | ICD-10-CM | POA: Diagnosis not present

## 2018-09-12 DIAGNOSIS — E538 Deficiency of other specified B group vitamins: Secondary | ICD-10-CM | POA: Diagnosis not present

## 2018-09-12 DIAGNOSIS — E782 Mixed hyperlipidemia: Secondary | ICD-10-CM | POA: Diagnosis not present

## 2018-09-12 DIAGNOSIS — E783 Hyperchylomicronemia: Secondary | ICD-10-CM | POA: Diagnosis not present

## 2018-09-12 DIAGNOSIS — Z23 Encounter for immunization: Secondary | ICD-10-CM | POA: Diagnosis not present

## 2018-09-12 DIAGNOSIS — E113513 Type 2 diabetes mellitus with proliferative diabetic retinopathy with macular edema, bilateral: Secondary | ICD-10-CM | POA: Diagnosis not present

## 2018-09-12 DIAGNOSIS — Z794 Long term (current) use of insulin: Secondary | ICD-10-CM | POA: Diagnosis not present

## 2018-09-18 ENCOUNTER — Encounter (INDEPENDENT_AMBULATORY_CARE_PROVIDER_SITE_OTHER): Payer: Self-pay | Admitting: *Deleted

## 2018-09-19 ENCOUNTER — Other Ambulatory Visit (INDEPENDENT_AMBULATORY_CARE_PROVIDER_SITE_OTHER): Payer: Self-pay | Admitting: *Deleted

## 2018-09-19 DIAGNOSIS — Z1211 Encounter for screening for malignant neoplasm of colon: Secondary | ICD-10-CM

## 2018-09-23 DIAGNOSIS — E11319 Type 2 diabetes mellitus with unspecified diabetic retinopathy without macular edema: Secondary | ICD-10-CM | POA: Diagnosis not present

## 2018-09-23 DIAGNOSIS — Z6828 Body mass index (BMI) 28.0-28.9, adult: Secondary | ICD-10-CM | POA: Diagnosis not present

## 2018-09-23 DIAGNOSIS — E1165 Type 2 diabetes mellitus with hyperglycemia: Secondary | ICD-10-CM | POA: Diagnosis not present

## 2018-09-23 DIAGNOSIS — Z299 Encounter for prophylactic measures, unspecified: Secondary | ICD-10-CM | POA: Diagnosis not present

## 2018-09-23 DIAGNOSIS — I639 Cerebral infarction, unspecified: Secondary | ICD-10-CM | POA: Diagnosis not present

## 2018-09-23 DIAGNOSIS — I10 Essential (primary) hypertension: Secondary | ICD-10-CM | POA: Diagnosis not present

## 2018-09-24 DIAGNOSIS — Z1211 Encounter for screening for malignant neoplasm of colon: Secondary | ICD-10-CM | POA: Insufficient documentation

## 2018-10-31 DIAGNOSIS — S40011A Contusion of right shoulder, initial encounter: Secondary | ICD-10-CM | POA: Diagnosis not present

## 2018-10-31 DIAGNOSIS — M549 Dorsalgia, unspecified: Secondary | ICD-10-CM | POA: Diagnosis not present

## 2018-10-31 DIAGNOSIS — Z79899 Other long term (current) drug therapy: Secondary | ICD-10-CM | POA: Diagnosis not present

## 2018-10-31 DIAGNOSIS — Z794 Long term (current) use of insulin: Secondary | ICD-10-CM | POA: Diagnosis not present

## 2018-10-31 DIAGNOSIS — S0990XA Unspecified injury of head, initial encounter: Secondary | ICD-10-CM | POA: Diagnosis not present

## 2018-10-31 DIAGNOSIS — M25511 Pain in right shoulder: Secondary | ICD-10-CM | POA: Diagnosis not present

## 2018-10-31 DIAGNOSIS — M25519 Pain in unspecified shoulder: Secondary | ICD-10-CM | POA: Diagnosis not present

## 2018-10-31 DIAGNOSIS — M542 Cervicalgia: Secondary | ICD-10-CM | POA: Diagnosis not present

## 2018-10-31 DIAGNOSIS — I10 Essential (primary) hypertension: Secondary | ICD-10-CM | POA: Diagnosis not present

## 2018-10-31 DIAGNOSIS — Z87891 Personal history of nicotine dependence: Secondary | ICD-10-CM | POA: Diagnosis not present

## 2018-10-31 DIAGNOSIS — R52 Pain, unspecified: Secondary | ICD-10-CM | POA: Diagnosis not present

## 2018-10-31 DIAGNOSIS — E119 Type 2 diabetes mellitus without complications: Secondary | ICD-10-CM | POA: Diagnosis not present

## 2018-12-02 ENCOUNTER — Encounter (INDEPENDENT_AMBULATORY_CARE_PROVIDER_SITE_OTHER): Payer: Self-pay | Admitting: *Deleted

## 2018-12-02 ENCOUNTER — Telehealth (INDEPENDENT_AMBULATORY_CARE_PROVIDER_SITE_OTHER): Payer: Self-pay | Admitting: *Deleted

## 2018-12-02 NOTE — Telephone Encounter (Signed)
Patient needs trilyte 

## 2018-12-03 ENCOUNTER — Telehealth (INDEPENDENT_AMBULATORY_CARE_PROVIDER_SITE_OTHER): Payer: Self-pay | Admitting: *Deleted

## 2018-12-03 ENCOUNTER — Encounter (INDEPENDENT_AMBULATORY_CARE_PROVIDER_SITE_OTHER): Payer: Self-pay | Admitting: *Deleted

## 2018-12-03 MED ORDER — PEG 3350-KCL-NA BICARB-NACL 420 G PO SOLR: 4000.0000 mL | Freq: Once | ORAL | 0 refills | Status: AC

## 2018-12-03 NOTE — Telephone Encounter (Signed)
Per Vaughan Basta with Dr Sabra Heck it is ok for patient to stop Plavix 5 days prior to procedure -- patient aware

## 2018-12-06 DIAGNOSIS — E1165 Type 2 diabetes mellitus with hyperglycemia: Secondary | ICD-10-CM | POA: Diagnosis not present

## 2018-12-06 DIAGNOSIS — E782 Mixed hyperlipidemia: Secondary | ICD-10-CM | POA: Diagnosis not present

## 2018-12-06 DIAGNOSIS — Z794 Long term (current) use of insulin: Secondary | ICD-10-CM | POA: Diagnosis not present

## 2018-12-13 DIAGNOSIS — E538 Deficiency of other specified B group vitamins: Secondary | ICD-10-CM | POA: Diagnosis not present

## 2018-12-13 DIAGNOSIS — Z794 Long term (current) use of insulin: Secondary | ICD-10-CM | POA: Diagnosis not present

## 2018-12-13 DIAGNOSIS — E1165 Type 2 diabetes mellitus with hyperglycemia: Secondary | ICD-10-CM | POA: Diagnosis not present

## 2018-12-13 DIAGNOSIS — E113513 Type 2 diabetes mellitus with proliferative diabetic retinopathy with macular edema, bilateral: Secondary | ICD-10-CM | POA: Diagnosis not present

## 2018-12-13 DIAGNOSIS — E782 Mixed hyperlipidemia: Secondary | ICD-10-CM | POA: Diagnosis not present

## 2018-12-13 DIAGNOSIS — E783 Hyperchylomicronemia: Secondary | ICD-10-CM | POA: Diagnosis not present

## 2018-12-13 DIAGNOSIS — I10 Essential (primary) hypertension: Secondary | ICD-10-CM | POA: Diagnosis not present

## 2018-12-24 ENCOUNTER — Telehealth (INDEPENDENT_AMBULATORY_CARE_PROVIDER_SITE_OTHER): Payer: Self-pay | Admitting: *Deleted

## 2018-12-24 NOTE — Telephone Encounter (Signed)
agree

## 2018-12-24 NOTE — Telephone Encounter (Signed)
Referring MD/PCP: vyas   Procedure: tcs  Reason/Indication:  screening  Has patient had this procedure before?  Yes, 2008  If so, when, by whom and where?    Is there a family history of colon cancer?  no  Who?  What age when diagnosed?    Is patient diabetic?   yes      Does patient have prosthetic heart valve or mechanical valve?  no  Do you have a pacemaker?  no  Has patient ever had endocarditis? no  Has patient had joint replacement within last 12 months?  no  Is patient constipated or do they take laxatives? no  Does patient have a history of alcohol/drug use?  no  Is patient on blood thinner such as Coumadin, Plavix and/or Aspirin? yes  Medications: plavix 75 mg daily, tresiba 80 units daily, victoza 3 mg, diltiazem 240 mg, losartan 100 mg daily, rosuvastatin 20 mg daily, ezetimibe 10 mg daily, metformin 500 mg qid, vascepa 1 g daily, novolog sliding scale, hctz 12.5 mg   Allergies: nkda  Medication Adjustment per Dr Lindi Adie, NP: plvix 5 days, tresida 30 units day before, victoza hold day before, metformin hold day before  Procedure date & time: 01/22/19 at 930

## 2019-01-14 IMAGING — DX DG CHEST 2V
2 series · 2 of 2 positions shown · non-contrast
Comparison: 04/08/2015

CLINICAL DATA: Cough and congestion for several days

EXAM:
CHEST  2 VIEW

[chest pa]
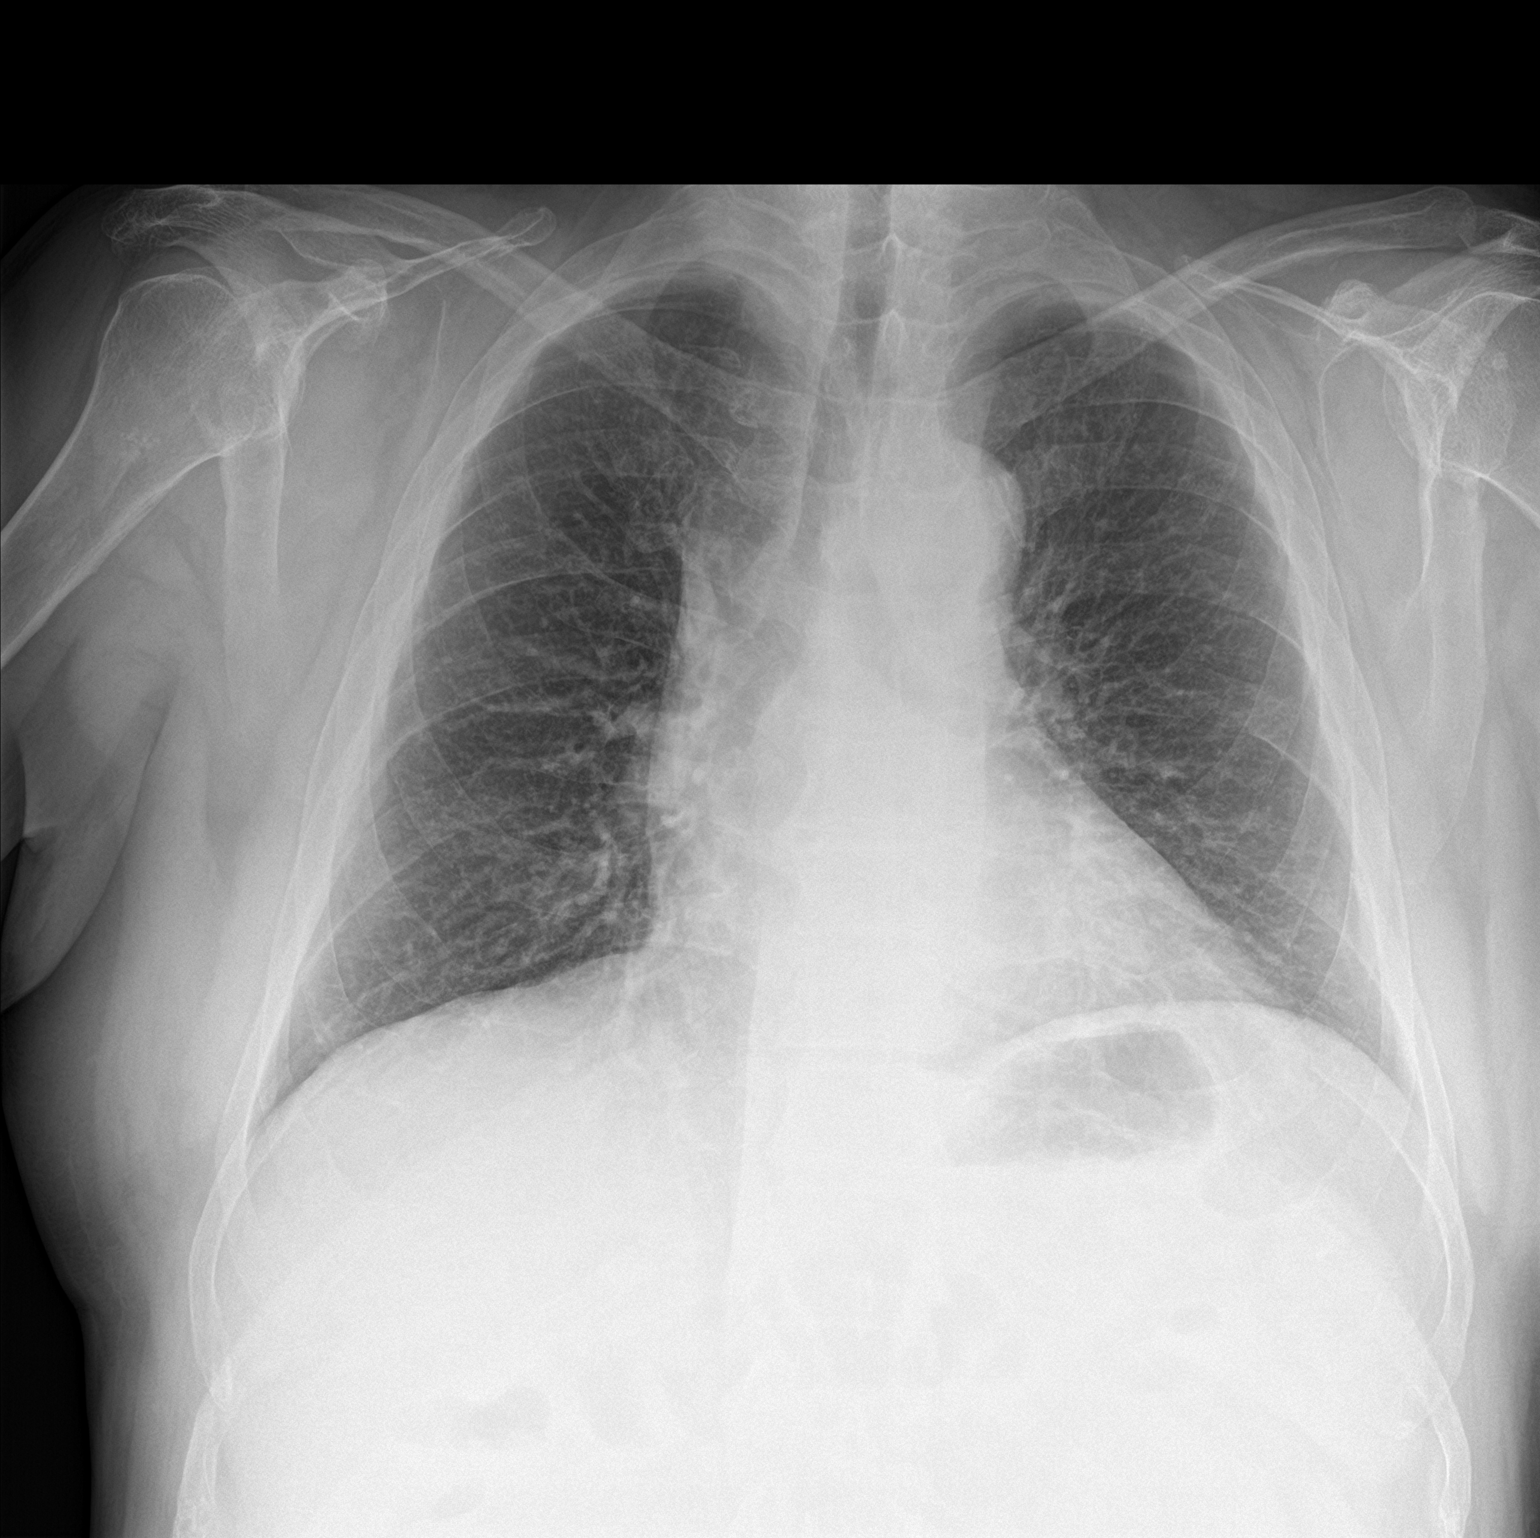

[chest lat]
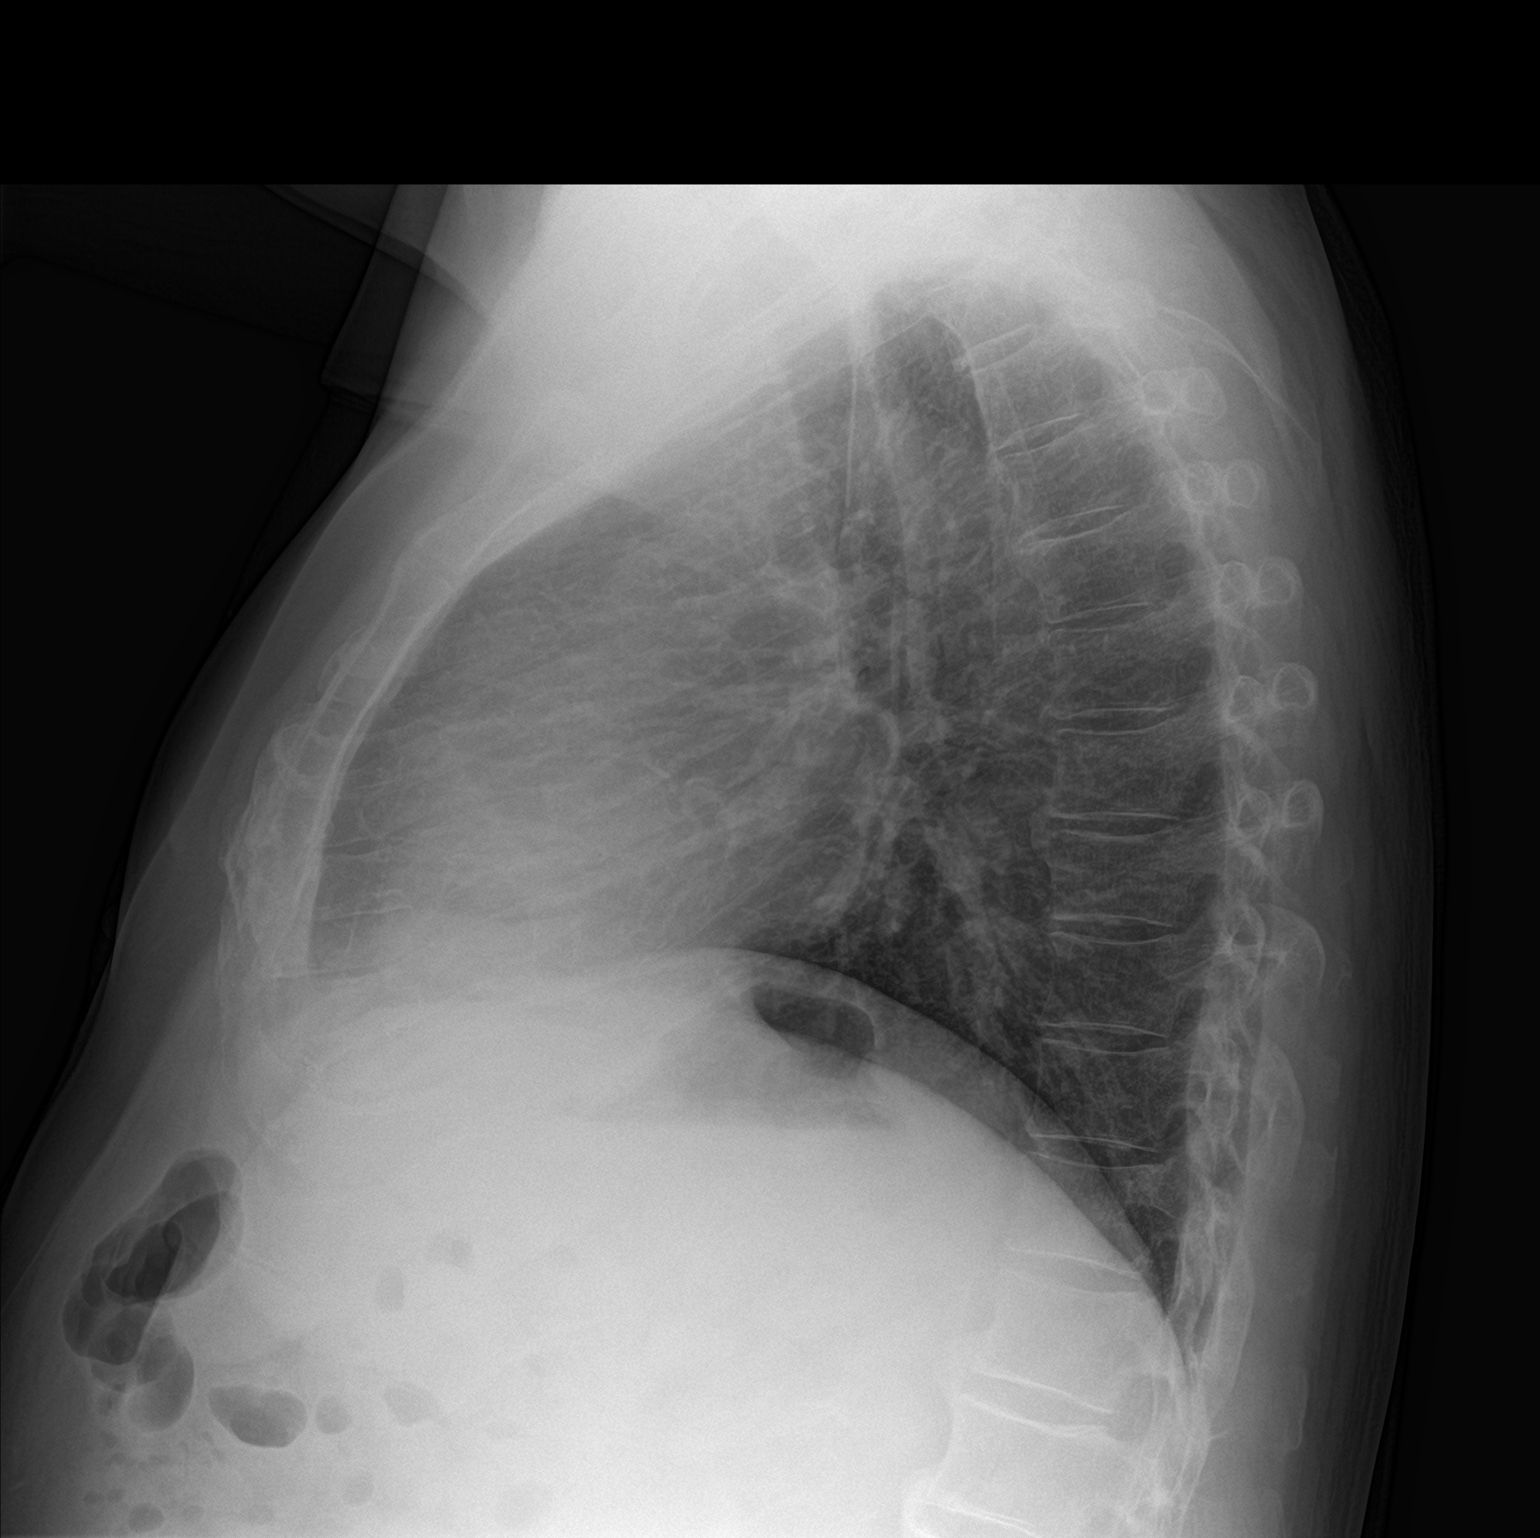

[2 of 2 positions shown; findings below may reference images not displayed]

FINDINGS: Cardiac shadow is within normal limits. Mild interstitial changes
are noted bilaterally. No focal infiltrate or sizable effusion is
seen. No acute bony abnormality is noted.
IMPRESSION: No active cardiopulmonary disease.

## 2019-01-16 ENCOUNTER — Encounter (INDEPENDENT_AMBULATORY_CARE_PROVIDER_SITE_OTHER): Payer: Self-pay | Admitting: *Deleted

## 2019-01-16 NOTE — Telephone Encounter (Signed)
Error   This encounter was created in error - please disregard. 

## 2019-01-22 ENCOUNTER — Other Ambulatory Visit: Payer: Self-pay

## 2019-01-22 ENCOUNTER — Ambulatory Visit (HOSPITAL_COMMUNITY)
Admission: RE | Admit: 2019-01-22 | Discharge: 2019-01-22 | Disposition: A | Discharge status: Home / Self Care | Payer: Medicare Other | Attending: Internal Medicine | Admitting: Internal Medicine

## 2019-01-22 ENCOUNTER — Encounter (HOSPITAL_COMMUNITY)
Admission: RE | Disposition: A | Discharge status: Home / Self Care | Payer: Self-pay | Source: Home / Self Care | Attending: Internal Medicine

## 2019-01-22 ENCOUNTER — Encounter (HOSPITAL_COMMUNITY): Payer: Self-pay | Admitting: *Deleted

## 2019-01-22 DIAGNOSIS — D123 Benign neoplasm of transverse colon: Secondary | ICD-10-CM | POA: Diagnosis not present

## 2019-01-22 DIAGNOSIS — Z79899 Other long term (current) drug therapy: Secondary | ICD-10-CM | POA: Insufficient documentation

## 2019-01-22 DIAGNOSIS — Z8673 Personal history of transient ischemic attack (TIA), and cerebral infarction without residual deficits: Secondary | ICD-10-CM | POA: Diagnosis not present

## 2019-01-22 DIAGNOSIS — Z9049 Acquired absence of other specified parts of digestive tract: Secondary | ICD-10-CM | POA: Diagnosis not present

## 2019-01-22 DIAGNOSIS — Z888 Allergy status to other drugs, medicaments and biological substances status: Secondary | ICD-10-CM | POA: Diagnosis not present

## 2019-01-22 DIAGNOSIS — Z87891 Personal history of nicotine dependence: Secondary | ICD-10-CM | POA: Insufficient documentation

## 2019-01-22 DIAGNOSIS — E7800 Pure hypercholesterolemia, unspecified: Secondary | ICD-10-CM | POA: Insufficient documentation

## 2019-01-22 DIAGNOSIS — Z7902 Long term (current) use of antithrombotics/antiplatelets: Secondary | ICD-10-CM | POA: Insufficient documentation

## 2019-01-22 DIAGNOSIS — Z794 Long term (current) use of insulin: Secondary | ICD-10-CM | POA: Insufficient documentation

## 2019-01-22 DIAGNOSIS — Z1211 Encounter for screening for malignant neoplasm of colon: Secondary | ICD-10-CM | POA: Insufficient documentation

## 2019-01-22 DIAGNOSIS — E119 Type 2 diabetes mellitus without complications: Secondary | ICD-10-CM | POA: Diagnosis not present

## 2019-01-22 DIAGNOSIS — I10 Essential (primary) hypertension: Secondary | ICD-10-CM | POA: Insufficient documentation

## 2019-01-22 HISTORY — PX: POLYPECTOMY: SHX5525

## 2019-01-22 HISTORY — DX: Pure hypercholesterolemia, unspecified: E78.00

## 2019-01-22 HISTORY — PX: COLONOSCOPY: SHX5424

## 2019-01-22 LAB — GLUCOSE, CAPILLARY: Glucose-Capillary: 81 mg/dL (ref 70–99)

## 2019-01-22 SURGERY — COLONOSCOPY
Anesthesia: Moderate Sedation

## 2019-01-22 MED ORDER — MEPERIDINE HCL 100 MG/ML IJ SOLN
INTRAMUSCULAR | Status: AC
  Filled 2019-01-22: qty 1

## 2019-01-22 MED ORDER — MIDAZOLAM HCL 5 MG/5ML IJ SOLN
INTRAMUSCULAR | Status: AC
  Filled 2019-01-22: qty 10

## 2019-01-22 MED ORDER — SODIUM CHLORIDE 0.9 % IV SOLN
INTRAVENOUS | Status: DC
  Administered 2019-01-22: 09:00:00 via INTRAVENOUS

## 2019-01-22 MED ORDER — MEPERIDINE HCL 50 MG/ML IJ SOLN
INTRAMUSCULAR | Status: DC | PRN
  Administered 2019-01-22 (×2): 25 mg via INTRAVENOUS

## 2019-01-22 MED ORDER — STERILE WATER FOR IRRIGATION IR SOLN
Status: DC | PRN
  Administered 2019-01-22: 09:00:00

## 2019-01-22 MED ORDER — MIDAZOLAM HCL 5 MG/5ML IJ SOLN
INTRAMUSCULAR | Status: DC | PRN
  Administered 2019-01-22 (×4): 2 mg via INTRAVENOUS

## 2019-01-22 NOTE — Discharge Instructions (Signed)
No aspirin or NSAIDs for 24 hours. °Resume usual medications and diet as before. °No driving for 24 hours. °Physician will call with biopsy results. ° °Colon Polyps ° °Polyps are tissue growths inside the body. Polyps can grow in many places, including the large intestine (colon). A polyp may be a round bump or a mushroom-shaped growth. You could have one polyp or several. °Most colon polyps are noncancerous (benign). However, some colon polyps can become cancerous over time. Finding and removing the polyps early can help prevent this. °What are the causes? °The exact cause of colon polyps is not known. °What increases the risk? °You are more likely to develop this condition if you: °· Have a family history of colon cancer or colon polyps. °· Are older than 50 or older than 45 if you are African American. °· Have inflammatory bowel disease, such as ulcerative colitis or Crohn's disease. °· Have certain hereditary conditions, such as: °? Familial adenomatous polyposis. °? Lynch syndrome. °? Turcot syndrome. °? Peutz-Jeghers syndrome. °· Are overweight. °· Smoke cigarettes. °· Do not get enough exercise. °· Drink too much alcohol. °· Eat a diet that is high in fat and red meat and low in fiber. °· Had childhood cancer that was treated with abdominal radiation. °What are the signs or symptoms? °Most polyps do not cause symptoms. °If you have symptoms, they may include: °· Blood coming from your rectum when having a bowel movement. °· Blood in your stool. The stool may look dark red or black. °· Abdominal pain. °· A change in bowel habits, such as constipation or diarrhea. °How is this diagnosed? °This condition is diagnosed with a colonoscopy. This is a procedure in which a lighted, flexible scope is inserted into the anus and then passed into the colon to examine the area. Polyps are sometimes found when a colonoscopy is done as part of routine cancer screening tests. °How is this treated? °Treatment for this  condition involves removing any polyps that are found. Most polyps can be removed during a colonoscopy. Those polyps will then be tested for cancer. Additional treatment may be needed depending on the results of testing. °Follow these instructions at home: °Lifestyle °· Maintain a healthy weight, or lose weight if recommended by your health care provider. °· Exercise every day or as told by your health care provider. °· Do not use any products that contain nicotine or tobacco, such as cigarettes and e-cigarettes. If you need help quitting, ask your health care provider. °· If you drink alcohol, limit how much you have: °? 0-1 drink a day for women. °? 0-2 drinks a day for men. °· Be aware of how much alcohol is in your drink. In the [place] one drink equals one 12 oz bottle of beer (355 mL), one 5 oz glass of wine (148 mL), or one 1½ oz shot of hard liquor (44 mL). °Eating and drinking ° °· Eat foods that are high in fiber, such as fruits, vegetables, and whole grains. °· Eat foods that are high in calcium and vitamin D, such as milk, cheese, yogurt, eggs, liver, fish, and broccoli. °· Limit foods that are high in fat, such as fried foods and desserts. °· Limit the amount of red meat and processed meat you eat, such as hot dogs, sausage, bacon, and lunch meats. °General instructions °· Keep all follow-up visits as told by your health care provider. This is important. °? This includes having regularly scheduled colonoscopies. °? Talk to your health care   provider about when you need a colonoscopy. °Contact a health care provider if: °· You have new or worsening bleeding during a bowel movement. °· You have new or increased blood in your stool. °· You have a change in bowel habits. °· You lose weight for no known reason. °Summary °· Polyps are tissue growths inside the body. Polyps can grow in many places, including the colon. °· Most colon polyps are noncancerous (benign), but some can become cancerous over  time. °· This condition is diagnosed with a colonoscopy. °· Treatment for this condition involves removing any polyps that are found. Most polyps can be removed during a colonoscopy. °This information is not intended to replace advice given to you by your health care provider. Make sure you discuss any questions you have with your health care provider. °Document Released: 08/09/2004 Document Revised: 02/28/2018 Document Reviewed: 02/28/2018 °Elsevier Interactive Patient Education © 2019 [employer] ° ° °Colonoscopy, Adult, Care After °This sheet gives you information about how to care for yourself after your procedure. Your health care provider may also give you more specific instructions. If you have problems or questions, contact your health care provider. °What can I expect after the procedure? °After the procedure, it is common to have: °· A small amount of blood in your stool for 24 hours after the procedure. °· Some gas. °· Mild abdominal cramping or bloating. °Follow these instructions at home: °General instructions °· For the first 24 hours after the procedure: °? Do not drive or use machinery. °? Do not sign important documents. °? Do not drink alcohol. °? Do your regular daily activities at a slower pace than normal. °? Eat soft, easy-to-digest foods. °· Take over-the-counter or prescription medicines only as told by your health care provider. °Relieving cramping and bloating ° °· Try walking around when you have cramps or feel bloated. °· Apply heat to your abdomen as told by your health care provider. Use a heat source that your health care provider recommends, such as a moist heat pack or a heating pad. °? Place a towel between your skin and the heat source. °? Leave the heat on for 20-30 minutes. °? Remove the heat if your skin turns bright red. This is especially important if you are unable to feel pain, heat, or cold. You may have a greater risk of getting burned. °Eating and drinking ° °· Drink  enough fluid to keep your urine pale yellow. °· Resume your normal diet as instructed by your health care provider. Avoid heavy or fried foods that are hard to digest. °· Avoid drinking alcohol for as long as instructed by your health care provider. °Contact a health care provider if: °· You have blood in your stool 2-3 days after the procedure. °Get help right away if: °· You have more than a small spotting of blood in your stool. °· You pass large blood clots in your stool. °· Your abdomen is swollen. °· You have nausea or vomiting. °· You have a fever. °· You have increasing abdominal pain that is not relieved with medicine. °Summary °· After the procedure, it is common to have a small amount of blood in your stool. You may also have mild abdominal cramping and bloating. °· For the first 24 hours after the procedure, do not drive or use machinery, sign important documents, or drink alcohol. °· Contact your health care provider if you have a lot of blood in your stool, nausea or vomiting, a fever, or increased abdominal   pain. °This information is not intended to replace advice given to you by your health care provider. Make sure you discuss any questions you have with your health care provider. °Document Released: 06/27/2004 Document Revised: 09/05/2017 Document Reviewed: 01/25/2016 °Elsevier Interactive Patient Education © 2019 Elsevier Inc. ° °

## 2019-01-22 NOTE — H&P (Addendum)
Marc Haynes is an 68 y.o. male.   Chief Complaint: Patient is here for colonoscopy. HPI: Patient is 68 year old Caucasian male who is here for screening colonoscopy.  Last exam was in 2008 with removal of small polyp from ascending colon and was hyperplastic.  He denies abdominal pain change in bowel habits or rectal bleeding. Last aspirin dose was 2 days ago. Family history is negative for CRC.  Past Medical History:  Diagnosis Date  . Diabetes mellitus without complication ([place])   . Eye abnormality    edema of eye .  has had laser surgeries.   . Hypercholesteremia   . Hypertension   . Sleep apnea    does not use CPAP  . Stroke [facility]) 2011    Past Surgical History:  Procedure Laterality Date  . APPENDECTOMY    . CHOLECYSTECTOMY    . COLONOSCOPY    . EYE SURGERY Left    Vitrectomy, bilateral cataracts  . FRACTURE SURGERY Left    lower leg/ankle  . HEMORRHOID SURGERY  2007?  Marland Kitchen PARS PLANA VITRECTOMY Right 12/10/2013   Procedure: PARS PLANA VITRECTOMY 25 GAUGE WITH ENDOLASER RIGHT EYE ;  Surgeon: [name], MD;  Location: [facility];  Service: Ophthalmology;  Laterality: Right;    History reviewed. No pertinent family history. Social History:  reports that he quit smoking about 31 years ago. His smoking use included cigarettes. He has never used smokeless tobacco. He reports current alcohol use. He reports that he does not use drugs.  Allergies:  Allergies  Allergen Reactions  . Cortisone Other (See Comments)    Pancreatitis   . Invokana [Canagliflozin] Other (See Comments)    Patient does not tolerate (hospitalized)     Medications Prior to Admission  Medication Sig Dispense Refill  . citalopram (CELEXA) 40 MG tablet Take 40 mg by mouth every evening.    . clopidogrel (PLAVIX) 75 MG tablet Take 75 mg by mouth every evening.     . diltiazem (TIAZAC) 240 MG 24 hr capsule Take 240 mg by mouth every evening.     . ezetimibe (ZETIA) 10 MG tablet Take 10 mg by mouth every  evening.     . hydrochlorothiazide (MICROZIDE) 12.5 MG capsule Take 12.5 mg by mouth every evening.     Marc Haynes Ethyl (VASCEPA) 1 g CAPS Take 2 g by mouth 2 (two) times daily.     . Insulin Degludec (TRESIBA FLEXTOUCH) 200 UNIT/ML SOPN Inject 120 Units into the skin daily.     Marland Kitchen losartan (COZAAR) 100 MG tablet Take 100 mg by mouth daily.    . metFORMIN (GLUCOPHAGE-XR) 500 MG 24 hr tablet Take 2,000 mg by mouth every evening.    . Multiple Vitamin (MULTIVITAMIN WITH MINERALS) TABS tablet Take 1 tablet by mouth every evening.     Marland Kitchen NOVOLOG FLEXPEN 100 UNIT/ML FlexPen Inject 8-30 Units into the skin 5 (five) times daily as needed. Sliding Scale Insulin    . pantoprazole (PROTONIX) 40 MG tablet Take 40 mg by mouth every evening.    . rosuvastatin (CRESTOR) 20 MG tablet Take 20 mg by mouth every evening.     Marland Kitchen VICTOZA 18 MG/3ML SOPN Inject 1.8 mg into the skin every evening.     Marland Kitchen acetaminophen (TYLENOL) 500 MG tablet Take 1,000 mg by mouth every 6 (six) hours as needed (for pain.).      Results for orders placed or performed during the hospital encounter of 01/22/19 (from the past 48 hour(s))  Glucose, capillary     Status: None   Collection Time: 01/22/19  8:50 AM  Result Value Ref Range   Glucose-Capillary 81 70 - 99 mg/dL   No results found.  ROS  Blood pressure (!) 163/95, pulse 85, temperature 97.7 F (36.5 C), temperature source Oral, resp. rate 17, height 5\' 11"  (1.803 m), weight 94.3 kg, SpO2 97 %. Physical Exam  Constitutional: He appears well-developed and well-nourished.  HENT:  Mouth/Throat: Oropharynx is clear and moist.  Eyes: Conjunctivae are normal. No scleral icterus.  Neck: No thyromegaly present.  Cardiovascular: Normal rate and regular rhythm.  No murmur heard. Respiratory: Effort normal and breath sounds normal.  GI:  Focal and patchy edema and induration to abdominal wall in right and left lower quadrant.  Horizontal scar and right upper quadrant.  Abdomen is  otherwise soft.  No organomegaly or masses.  Musculoskeletal:        General: No edema.  Neurological: He is alert.  Skin: Skin is warm and dry.     Assessment/Plan Average risk screening colonoscopy.  Hildred Laser, MD 01/22/2019, 9:19 AM

## 2019-01-22 NOTE — Op Note (Signed)
[facility] Patient Name: Marc Haynes Procedure Date: 01/22/2019 9:18 AM MRN: [id number] Date of Birth: 1951/02/25 Attending MD: Hildred Laser , MD CSN: [id number] Age: 68 Admit Type: Outpatient Procedure:                Colonoscopy Indications:              Screening for colorectal malignant neoplasm Providers:                Hildred Laser, MD, Otis Peak B. Sharon Seller, RN, Nelma Rothman, Technician Referring MD:             [name], MD Medicines:                Meperidine 50 mg IV, Midazolam 8 mg IV Complications:            No immediate complications. Estimated Blood Loss:     Estimated blood loss was minimal. Procedure:                Pre-Anesthesia Assessment:                           - Prior to the procedure, a History and Physical                            was performed, and patient medications and                            allergies were reviewed. The patient's tolerance of                            previous anesthesia was also reviewed. The risks                            and benefits of the procedure and the sedation                            options and risks were discussed with the patient.                            All questions were answered, and informed consent                            was obtained. Prior Anticoagulants: The patient                            last took aspirin 2 days prior to the procedure.                            ASA Grade Assessment: II - A patient with mild                            systemic disease. After reviewing the risks and  benefits, the patient was deemed in satisfactory                            condition to undergo the procedure.                           After obtaining informed consent, the colonoscope                            was passed under direct vision. Throughout the                            procedure, the patient's blood pressure, pulse, and               oxygen saturations were monitored continuously. The                            PCF-H190DL ([id number]) scope was introduced through                            the anus and advanced to the the cecum, identified                            by appendiceal orifice and ileocecal valve. The                            colonoscopy was performed without difficulty. The                            patient tolerated the procedure well. The quality                            of the bowel preparation was adequate. The                            ileocecal valve, appendiceal orifice, and rectum                            were photographed. Scope In: 9:32:45 AM Scope Out: 9:53:36 AM Scope Withdrawal Time: 0 hours 13 minutes 2 seconds  Total Procedure Duration: 0 hours 20 minutes 51 seconds  Findings:      The perianal and digital rectal examinations were normal.      A small polyp was found in the proximal transverse colon. The polyp was       sessile. Biopsies were taken with a cold forceps for histology.      The exam was otherwise normal throughout the examined colon.      The retroflexed view of the distal rectum and anal verge was normal and       showed no anal or rectal abnormalities. Impression:               - One small polyp in the proximal transverse colon.                            Biopsied. Moderate Sedation:  Moderate (conscious) sedation was administered by the endoscopy nurse       and supervised by the endoscopist. The following parameters were       monitored: oxygen saturation, heart rate, blood pressure, CO2       capnography and response to care. Total physician intraservice time was       29 minutes. Recommendation:           - Patient has a contact number available for                            emergencies. The signs and symptoms of potential                            delayed complications were discussed with the                            patient. Return to normal  activities tomorrow.                            Written discharge instructions were provided to the                            patient.                           - Resume previous diet today.                           - Continue present medications.                           - No aspirin, ibuprofen, naproxen, or other                            non-steroidal anti-inflammatory drugs for 1 day.                           - Await pathology results.                           - Repeat colonoscopy is recommended. The                            colonoscopy date will be determined after pathology                            results from today's exam become available for                            review. Procedure Code(s):        --- Professional ---                           [phone], Colonoscopy, flexible; with biopsy, single  or multiple                           99153, Moderate sedation; each additional 15                            minutes intraservice time                           G0500, Moderate sedation services provided by the                            same physician or other qualified health care                            professional performing a gastrointestinal                            endoscopic service that sedation supports,                            requiring the presence of an independent trained                            observer to assist in the monitoring of the                            patient's level of consciousness and physiological                            status; initial 15 minutes of intra-service time;                            patient age 71 years or older (additional time may                            be reported with [phone], as appropriate) Diagnosis Code(s):        --- Professional ---                           Z12.11, Encounter for screening for malignant                            neoplasm of colon                            D12.3, Benign neoplasm of transverse colon (hepatic                            flexure or splenic flexure) CPT copyright 2018 American Medical Association. All rights reserved. The codes documented in this report are preliminary and upon coder review may  be revised to meet current compliance requirements. Hildred Laser, MD Hildred Laser, MD 01/22/2019 9:59:04 AM This report has been signed electronically. Number of Addenda: 0

## 2019-01-23 DIAGNOSIS — I10 Essential (primary) hypertension: Secondary | ICD-10-CM | POA: Diagnosis not present

## 2019-01-23 DIAGNOSIS — Z299 Encounter for prophylactic measures, unspecified: Secondary | ICD-10-CM | POA: Diagnosis not present

## 2019-01-23 DIAGNOSIS — G4700 Insomnia, unspecified: Secondary | ICD-10-CM | POA: Diagnosis not present

## 2019-01-23 DIAGNOSIS — Z6830 Body mass index (BMI) 30.0-30.9, adult: Secondary | ICD-10-CM | POA: Diagnosis not present

## 2019-01-23 DIAGNOSIS — E1165 Type 2 diabetes mellitus with hyperglycemia: Secondary | ICD-10-CM | POA: Diagnosis not present

## 2019-01-23 DIAGNOSIS — E11319 Type 2 diabetes mellitus with unspecified diabetic retinopathy without macular edema: Secondary | ICD-10-CM | POA: Diagnosis not present

## 2019-01-23 DIAGNOSIS — F321 Major depressive disorder, single episode, moderate: Secondary | ICD-10-CM | POA: Diagnosis not present

## 2019-01-27 ENCOUNTER — Encounter (HOSPITAL_COMMUNITY): Payer: Self-pay | Admitting: Internal Medicine

## 2019-03-10 DIAGNOSIS — E1165 Type 2 diabetes mellitus with hyperglycemia: Secondary | ICD-10-CM | POA: Diagnosis not present

## 2019-03-10 DIAGNOSIS — E538 Deficiency of other specified B group vitamins: Secondary | ICD-10-CM | POA: Diagnosis not present

## 2019-03-10 DIAGNOSIS — Z794 Long term (current) use of insulin: Secondary | ICD-10-CM | POA: Diagnosis not present

## 2019-03-10 DIAGNOSIS — E782 Mixed hyperlipidemia: Secondary | ICD-10-CM | POA: Diagnosis not present

## 2019-03-17 DIAGNOSIS — E782 Mixed hyperlipidemia: Secondary | ICD-10-CM | POA: Diagnosis not present

## 2019-03-17 DIAGNOSIS — E783 Hyperchylomicronemia: Secondary | ICD-10-CM | POA: Diagnosis not present

## 2019-03-17 DIAGNOSIS — I10 Essential (primary) hypertension: Secondary | ICD-10-CM | POA: Diagnosis not present

## 2019-03-17 DIAGNOSIS — E113513 Type 2 diabetes mellitus with proliferative diabetic retinopathy with macular edema, bilateral: Secondary | ICD-10-CM | POA: Diagnosis not present

## 2019-03-17 DIAGNOSIS — Z794 Long term (current) use of insulin: Secondary | ICD-10-CM | POA: Diagnosis not present

## 2019-03-17 DIAGNOSIS — E538 Deficiency of other specified B group vitamins: Secondary | ICD-10-CM | POA: Diagnosis not present

## 2019-03-17 DIAGNOSIS — E1165 Type 2 diabetes mellitus with hyperglycemia: Secondary | ICD-10-CM | POA: Diagnosis not present

## 2019-04-16 DIAGNOSIS — H35043 Retinal micro-aneurysms, unspecified, bilateral: Secondary | ICD-10-CM | POA: Diagnosis not present

## 2019-04-16 DIAGNOSIS — Z9889 Other specified postprocedural states: Secondary | ICD-10-CM | POA: Diagnosis not present

## 2019-04-16 DIAGNOSIS — H35371 Puckering of macula, right eye: Secondary | ICD-10-CM | POA: Diagnosis not present

## 2019-04-16 DIAGNOSIS — E103553 Type 1 diabetes mellitus with stable proliferative diabetic retinopathy, bilateral: Secondary | ICD-10-CM | POA: Diagnosis not present

## 2019-06-16 DIAGNOSIS — E1165 Type 2 diabetes mellitus with hyperglycemia: Secondary | ICD-10-CM | POA: Diagnosis not present

## 2019-06-16 DIAGNOSIS — Z794 Long term (current) use of insulin: Secondary | ICD-10-CM | POA: Diagnosis not present

## 2019-06-16 DIAGNOSIS — E782 Mixed hyperlipidemia: Secondary | ICD-10-CM | POA: Diagnosis not present

## 2019-06-23 DIAGNOSIS — I10 Essential (primary) hypertension: Secondary | ICD-10-CM | POA: Diagnosis not present

## 2019-06-23 DIAGNOSIS — Z794 Long term (current) use of insulin: Secondary | ICD-10-CM | POA: Diagnosis not present

## 2019-06-23 DIAGNOSIS — E783 Hyperchylomicronemia: Secondary | ICD-10-CM | POA: Diagnosis not present

## 2019-06-23 DIAGNOSIS — E782 Mixed hyperlipidemia: Secondary | ICD-10-CM | POA: Diagnosis not present

## 2019-06-23 DIAGNOSIS — Z8673 Personal history of transient ischemic attack (TIA), and cerebral infarction without residual deficits: Secondary | ICD-10-CM | POA: Diagnosis not present

## 2019-06-23 DIAGNOSIS — E538 Deficiency of other specified B group vitamins: Secondary | ICD-10-CM | POA: Diagnosis not present

## 2019-06-23 DIAGNOSIS — E113513 Type 2 diabetes mellitus with proliferative diabetic retinopathy with macular edema, bilateral: Secondary | ICD-10-CM | POA: Diagnosis not present

## 2019-06-23 DIAGNOSIS — E1165 Type 2 diabetes mellitus with hyperglycemia: Secondary | ICD-10-CM | POA: Diagnosis not present

## 2019-09-22 DIAGNOSIS — E782 Mixed hyperlipidemia: Secondary | ICD-10-CM | POA: Diagnosis not present

## 2019-09-22 DIAGNOSIS — Z794 Long term (current) use of insulin: Secondary | ICD-10-CM | POA: Diagnosis not present

## 2019-09-22 DIAGNOSIS — E1165 Type 2 diabetes mellitus with hyperglycemia: Secondary | ICD-10-CM | POA: Diagnosis not present

## 2019-09-25 DIAGNOSIS — Z794 Long term (current) use of insulin: Secondary | ICD-10-CM | POA: Diagnosis not present

## 2019-09-25 DIAGNOSIS — I10 Essential (primary) hypertension: Secondary | ICD-10-CM | POA: Diagnosis not present

## 2019-09-25 DIAGNOSIS — R809 Proteinuria, unspecified: Secondary | ICD-10-CM | POA: Diagnosis not present

## 2019-09-25 DIAGNOSIS — E113513 Type 2 diabetes mellitus with proliferative diabetic retinopathy with macular edema, bilateral: Secondary | ICD-10-CM | POA: Diagnosis not present

## 2019-09-25 DIAGNOSIS — E783 Hyperchylomicronemia: Secondary | ICD-10-CM | POA: Diagnosis not present

## 2019-09-25 DIAGNOSIS — I679 Cerebrovascular disease, unspecified: Secondary | ICD-10-CM | POA: Diagnosis not present

## 2019-09-25 DIAGNOSIS — E538 Deficiency of other specified B group vitamins: Secondary | ICD-10-CM | POA: Diagnosis not present

## 2019-09-25 DIAGNOSIS — E1129 Type 2 diabetes mellitus with other diabetic kidney complication: Secondary | ICD-10-CM | POA: Diagnosis not present

## 2019-09-25 DIAGNOSIS — E782 Mixed hyperlipidemia: Secondary | ICD-10-CM | POA: Diagnosis not present

## 2019-09-29 DIAGNOSIS — Z299 Encounter for prophylactic measures, unspecified: Secondary | ICD-10-CM | POA: Diagnosis not present

## 2019-09-29 DIAGNOSIS — I10 Essential (primary) hypertension: Secondary | ICD-10-CM | POA: Diagnosis not present

## 2019-09-29 DIAGNOSIS — E11319 Type 2 diabetes mellitus with unspecified diabetic retinopathy without macular edema: Secondary | ICD-10-CM | POA: Diagnosis not present

## 2019-09-29 DIAGNOSIS — E1165 Type 2 diabetes mellitus with hyperglycemia: Secondary | ICD-10-CM | POA: Diagnosis not present

## 2019-09-29 DIAGNOSIS — G4700 Insomnia, unspecified: Secondary | ICD-10-CM | POA: Diagnosis not present

## 2019-09-29 DIAGNOSIS — Z6828 Body mass index (BMI) 28.0-28.9, adult: Secondary | ICD-10-CM | POA: Diagnosis not present

## 2019-09-29 DIAGNOSIS — E7800 Pure hypercholesterolemia, unspecified: Secondary | ICD-10-CM | POA: Diagnosis not present

## 2019-10-15 DIAGNOSIS — Z7189 Other specified counseling: Secondary | ICD-10-CM | POA: Diagnosis not present

## 2019-10-15 DIAGNOSIS — I10 Essential (primary) hypertension: Secondary | ICD-10-CM | POA: Diagnosis not present

## 2019-10-15 DIAGNOSIS — Z6830 Body mass index (BMI) 30.0-30.9, adult: Secondary | ICD-10-CM | POA: Diagnosis not present

## 2019-10-15 DIAGNOSIS — R5383 Other fatigue: Secondary | ICD-10-CM | POA: Diagnosis not present

## 2019-10-15 DIAGNOSIS — Z1331 Encounter for screening for depression: Secondary | ICD-10-CM | POA: Diagnosis not present

## 2019-10-15 DIAGNOSIS — Z299 Encounter for prophylactic measures, unspecified: Secondary | ICD-10-CM | POA: Diagnosis not present

## 2019-10-15 DIAGNOSIS — Z0000 Encounter for general adult medical examination without abnormal findings: Secondary | ICD-10-CM | POA: Diagnosis not present

## 2019-10-15 DIAGNOSIS — Z1339 Encounter for screening examination for other mental health and behavioral disorders: Secondary | ICD-10-CM | POA: Diagnosis not present

## 2019-10-15 DIAGNOSIS — E7800 Pure hypercholesterolemia, unspecified: Secondary | ICD-10-CM | POA: Diagnosis not present

## 2019-10-15 DIAGNOSIS — F321 Major depressive disorder, single episode, moderate: Secondary | ICD-10-CM | POA: Diagnosis not present

## 2019-10-31 DIAGNOSIS — Z23 Encounter for immunization: Secondary | ICD-10-CM | POA: Diagnosis not present

## 2020-05-18 ENCOUNTER — Other Ambulatory Visit: Payer: Self-pay

## 2020-05-18 ENCOUNTER — Ambulatory Visit (INDEPENDENT_AMBULATORY_CARE_PROVIDER_SITE_OTHER): Payer: Medicare Other | Admitting: Ophthalmology

## 2020-05-18 DIAGNOSIS — E113511 Type 2 diabetes mellitus with proliferative diabetic retinopathy with macular edema, right eye: Secondary | ICD-10-CM | POA: Diagnosis not present

## 2020-05-18 DIAGNOSIS — H35072 Retinal telangiectasis, left eye: Secondary | ICD-10-CM | POA: Diagnosis not present

## 2020-05-18 NOTE — Progress Notes (Signed)
05/18/2020     CHIEF COMPLAINT Patient presents for Retina Follow Up   HISTORY OF PRESENT ILLNESS: Marc Haynes is a 69 y.o. male who presents to the clinic today for:   HPI    Retina Follow Up    Patient presents with  Diabetic Retinopathy.  In both eyes.  Duration of 3 months.  Since onset it is stable.          Comments    3 month follow up - OCT OU Patient denies change in vision and overall has no complaints. LBS 146 // A1C 7.3       Last edited by Gerda Diss on 05/18/2020  9:11 AM. (History)      Referring physician: [name], MD [address] [place],  [place] [address]  HISTORICAL INFORMATION:   Selected notes from the [place]: No current outpatient medications on file. (Ophthalmic Drugs)   No current facility-administered medications for this visit. (Ophthalmic Drugs)   Current Outpatient Medications (Other)  Medication Sig  . acetaminophen (TYLENOL) 500 MG tablet Take 1,000 mg by mouth every 6 (six) hours as needed (for pain.).  Marland Kitchen citalopram (CELEXA) 40 MG tablet Take 40 mg by mouth every evening.  . clopidogrel (PLAVIX) 75 MG tablet Take 75 mg by mouth every evening.   . diltiazem (TIAZAC) 240 MG 24 hr capsule Take 240 mg by mouth every evening.   . ezetimibe (ZETIA) 10 MG tablet Take 10 mg by mouth every evening.   . hydrochlorothiazide (MICROZIDE) 12.5 MG capsule Take 12.5 mg by mouth every evening.   [name] Ethyl (VASCEPA) 1 g CAPS Take 2 g by mouth 2 (two) times daily.   . Insulin Degludec (TRESIBA FLEXTOUCH) 200 UNIT/ML SOPN Inject 120 Units into the skin daily.   Marland Kitchen losartan (COZAAR) 100 MG tablet Take 100 mg by mouth daily.  . metFORMIN (GLUCOPHAGE-XR) 500 MG 24 hr tablet Take 2,000 mg by mouth every evening.  . Multiple Vitamin (MULTIVITAMIN WITH MINERALS) TABS tablet Take 1 tablet by mouth every evening.   Marland Kitchen NOVOLOG FLEXPEN 100 UNIT/ML FlexPen Inject 8-30 Units into the skin 5 (five) times daily  as needed. Sliding Scale Insulin  . pantoprazole (PROTONIX) 40 MG tablet Take 40 mg by mouth every evening.  . rosuvastatin (CRESTOR) 20 MG tablet Take 20 mg by mouth every evening.   Marland Kitchen VICTOZA 18 MG/3ML SOPN Inject 1.8 mg into the skin every evening.    No current facility-administered medications for this visit. (Other)      REVIEW OF SYSTEMS:    ALLERGIES Allergies  Allergen Reactions  . Cortisone Other (See Comments)    Pancreatitis   . Invokana [Canagliflozin] Other (See Comments)    Patient does not tolerate (hospitalized)     PAST MEDICAL HISTORY Past Medical History:  Diagnosis Date  . Diabetes mellitus without complication ([place])   . Eye abnormality    edema of eye .  has had laser surgeries.   . Hypercholesteremia   . Hypertension   . Sleep apnea    does not use CPAP  . Stroke [facility]) 2011   Past Surgical History:  Procedure Laterality Date  . APPENDECTOMY    . CHOLECYSTECTOMY    . COLONOSCOPY    . COLONOSCOPY N/A 01/22/2019   Procedure: COLONOSCOPY;  Surgeon: Rogene Houston, MD;  [facility];  Service: Endoscopy;  Laterality: N/A;  9:30  . EYE  SURGERY Left    Vitrectomy, bilateral cataracts  . FRACTURE SURGERY Left    lower leg/ankle  . HEMORRHOID SURGERY  2007?  Marland Kitchen PARS PLANA VITRECTOMY Right 12/10/2013   Procedure: PARS PLANA VITRECTOMY 25 GAUGE WITH ENDOLASER RIGHT EYE ;  Surgeon: [name], MD;  Location: [place];  Service: Ophthalmology;  Laterality: Right;  . POLYPECTOMY  01/22/2019   Procedure: POLYPECTOMY;  Surgeon: Rogene Houston, MD;  [facility];  Service: Endoscopy;;  colon    FAMILY HISTORY No family history on file.  SOCIAL HISTORY Social History   Tobacco Use  . Smoking status: Former Smoker    Types: Cigarettes    Quit date: 12/11/1987    Years since quitting: 32.4  . Smokeless tobacco: Never Used  Vaping Use  . Vaping Use: Never used  Substance Use Topics  . Alcohol use: Yes    Comment: occasional    . Drug use: No         OPHTHALMIC EXAM: Base Eye Exam    Visual Acuity (Snellen - Linear)      Right Left   Dist [place] 20/25+2 20/20-1       Tonometry (Tonopen, 9:15 AM)      Right Left   Pressure 10 12       Pupils      Pupils Dark Light Shape React APD   Right PERRL 6 5 Round Brisk None   Left PERRL 6 5 Round Brisk None       Visual Fields (Counting fingers)      Left Right    Full Full       Extraocular Movement      Right Left    Full Full       Neuro/Psych    Oriented x3: Yes   Mood/Affect: Normal       Dilation    Both eyes: 1.0% Mydriacyl, 2.5% Phenylephrine @ 9:15 AM        Slit Lamp and Fundus Exam    External Exam      Right Left   External Normal Normal       Slit Lamp Exam      Right Left   Lids/Lashes Normal Normal   Conjunctiva/Sclera White and quiet White and quiet   Cornea Clear Clear   Anterior Chamber Deep and quiet Deep and quiet   Iris Round and reactive Round and reactive   Lens Posterior chamber intraocular lens Posterior chamber intraocular lens   Anterior Vitreous Normal Normal       Fundus Exam      Right Left   Posterior Vitreous Normal Normal   Disc Normal Normal   C/D Ratio 0.65 0.5   Macula Microaneurysms, Macular thickening, temporal aspect of the fovea in the region of previous capillary dropout noted in angiography performed February 2021 Microaneurysms, no exudates, no macular thickening   Vessels  PDR-quiet PDR-quiet   Periphery Normal Normal          IMAGING AND PROCEDURES  Imaging and Procedures for 05/18/20  OCT, Retina - OU - Both Eyes       Right Eye Quality was good. Scan locations included subfoveal. Central Foveal Thickness: 307. Progression has worsened.   Left Eye Quality was good. Scan locations included subfoveal. Central Foveal Thickness: 292. Progression has been stable.   Notes CSME focal, temporal to the fovea.  OD  OS, micro-CME on the temporal aspect of the fovea has vastly  improved now  that patient has continued on CPAP                  ASSESSMENT/PLAN:  Retinal telangiectasia of left eye OS, vastly improved on CPAP use.  Patient has other secondary gain from the use of CPAP in terms of sense of wellbeing, sleeping through the night, less frequent stooling bathroom nightly      ICD-10-CM   1. Diabetic macular edema of right eye with proliferative retinopathy associated with type 2 diabetes mellitus (HCC)  E11.3511 OCT, Retina - OU - Both Eyes  2. Retinal telangiectasia of left eye  H35.072 OCT, Retina - OU - Both Eyes    1.  2.  3.  Ophthalmic Meds Ordered this visit:  No orders of the defined types were placed in this encounter.      Return in about 2 weeks (around 06/01/2020) for OD, dilate, FOCAL, OCT.  There are no Patient Instructions on file for this visit.   Explained the diagnoses, plan, and follow up with the patient and they expressed understanding.  Patient expressed understanding of the importance of proper follow up care.   Clent Demark Shandell Jallow M.D. Diseases & Surgery of the Retina and Vitreous Retina & Diabetic [place] 05/18/20     Abbreviations: M myopia (nearsighted); A astigmatism; H hyperopia (farsighted); P presbyopia; Mrx spectacle prescription;  CTL contact lenses; OD right eye; OS left eye; OU both eyes  XT exotropia; ET esotropia; PEK punctate epithelial keratitis; PEE punctate epithelial erosions; DES dry eye syndrome; MGD meibomian gland dysfunction; ATs artificial tears; PFAT's preservative free artificial tears; [place] nuclear sclerotic cataract; PSC posterior subcapsular cataract; ERM epi-retinal membrane; PVD posterior vitreous detachment; RD retinal detachment; DM diabetes mellitus; DR diabetic retinopathy; NPDR non-proliferative diabetic retinopathy; PDR proliferative diabetic retinopathy; CSME clinically significant macular edema; DME diabetic macular edema; dbh dot blot hemorrhages; CWS cotton wool spot; POAG  primary open angle glaucoma; C/D cup-to-disc ratio; HVF humphrey visual field; GVF goldmann visual field; OCT optical coherence tomography; IOP intraocular pressure; BRVO Branch retinal vein occlusion; CRVO central retinal vein occlusion; CRAO central retinal artery occlusion; BRAO branch retinal artery occlusion; RT retinal tear; SB scleral buckle; PPV pars plana vitrectomy; VH Vitreous hemorrhage; PRP panretinal laser photocoagulation; IVK intravitreal kenalog; VMT vitreomacular traction; MH Macular hole;  NVD neovascularization of the disc; NVE neovascularization elsewhere; AREDS age related eye disease study; ARMD age related macular degeneration; POAG primary open angle glaucoma; EBMD epithelial/anterior basement membrane dystrophy; ACIOL anterior chamber intraocular lens; IOL intraocular lens; PCIOL posterior chamber intraocular lens; Phaco/IOL phacoemulsification with intraocular lens placement; [place] photorefractive keratectomy; LASIK laser assisted in situ keratomileusis; HTN hypertension; DM diabetes mellitus; COPD chronic obstructive pulmonary disease

## 2020-05-18 NOTE — Assessment & Plan Note (Signed)
OS, vastly improved on CPAP use.  Patient has other secondary gain from the use of CPAP in terms of sense of wellbeing, sleeping through the night, less frequent stooling bathroom nightly

## 2020-06-01 ENCOUNTER — Ambulatory Visit (INDEPENDENT_AMBULATORY_CARE_PROVIDER_SITE_OTHER): Payer: Medicare Other | Admitting: Ophthalmology

## 2020-06-01 ENCOUNTER — Encounter (INDEPENDENT_AMBULATORY_CARE_PROVIDER_SITE_OTHER): Payer: Self-pay | Admitting: Ophthalmology

## 2020-06-01 ENCOUNTER — Other Ambulatory Visit: Payer: Self-pay

## 2020-06-01 DIAGNOSIS — E113511 Type 2 diabetes mellitus with proliferative diabetic retinopathy with macular edema, right eye: Secondary | ICD-10-CM | POA: Diagnosis not present

## 2020-06-01 NOTE — Progress Notes (Signed)
06/01/2020     CHIEF COMPLAINT Patient presents for Retina Follow Up   HISTORY OF PRESENT ILLNESS: Marc Haynes is a 69 y.o. male who presents to the clinic today for:   HPI    Retina Follow Up    Patient presents with  Diabetic Retinopathy.  In right eye.  Duration of 2 weeks.  Since onset it is stable.          Comments    2 week fu - OCT OU, Poss Focal OD Patient denies change in vision and overall has no complaints. LBS 72 /// A1C 7.7       Last edited by Gerda Diss on 06/01/2020  9:30 AM. (History)      Referring physician: [name], MD [place],  [place] [address]  HISTORICAL INFORMATION:   Selected notes from the [facility]: No current outpatient medications on file. (Ophthalmic Drugs)   No current facility-administered medications for this visit. (Ophthalmic Drugs)   Current Outpatient Medications (Other)  Medication Sig  . acetaminophen (TYLENOL) 500 MG tablet Take 1,000 mg by mouth every 6 (six) hours as needed (for pain.).  Marland Kitchen citalopram (CELEXA) 40 MG tablet Take 40 mg by mouth every evening.  . clopidogrel (PLAVIX) 75 MG tablet Take 75 mg by mouth every evening.   . diltiazem (TIAZAC) 240 MG 24 hr capsule Take 240 mg by mouth every evening.   . ezetimibe (ZETIA) 10 MG tablet Take 10 mg by mouth every evening.   . hydrochlorothiazide (MICROZIDE) 12.5 MG capsule Take 12.5 mg by mouth every evening.   [name] Ethyl (VASCEPA) 1 g CAPS Take 2 g by mouth 2 (two) times daily.   . Insulin Degludec (TRESIBA FLEXTOUCH) 200 UNIT/ML SOPN Inject 120 Units into the skin daily.   Marland Kitchen losartan (COZAAR) 100 MG tablet Take 100 mg by mouth daily.  . metFORMIN (GLUCOPHAGE-XR) 500 MG 24 hr tablet Take 2,000 mg by mouth every evening.  . Multiple Vitamin (MULTIVITAMIN WITH MINERALS) TABS tablet Take 1 tablet by mouth every evening.   Marland Kitchen NOVOLOG FLEXPEN 100 UNIT/ML FlexPen Inject 8-30 Units into the skin 5 (five) times  daily as needed. Sliding Scale Insulin  . pantoprazole (PROTONIX) 40 MG tablet Take 40 mg by mouth every evening.  . rosuvastatin (CRESTOR) 20 MG tablet Take 20 mg by mouth every evening.   Marland Kitchen VICTOZA 18 MG/3ML SOPN Inject 1.8 mg into the skin every evening.    No current facility-administered medications for this visit. (Other)      REVIEW OF SYSTEMS: ROS    Negative for: Constitutional, Gastrointestinal, Neurological, Skin, Genitourinary, Musculoskeletal, HENT, Endocrine, Cardiovascular, Eyes, Respiratory, Psychiatric, Allergic/Imm, Heme/Lymph   Last edited by Gerda Diss on 06/01/2020  9:27 AM. (History)       ALLERGIES Allergies  Allergen Reactions  . Cortisone Other (See Comments)    Pancreatitis   . Invokana [Canagliflozin] Other (See Comments)    Patient does not tolerate (hospitalized)     PAST MEDICAL HISTORY Past Medical History:  Diagnosis Date  . Diabetes mellitus without complication ([place])   . Eye abnormality    edema of eye .  has had laser surgeries.   . Hypercholesteremia   . Hypertension   . Sleep apnea    does not use CPAP  . Stroke [facility]) 2011   Past Surgical History:  Procedure Laterality Date  . APPENDECTOMY    . CHOLECYSTECTOMY    .  COLONOSCOPY    . COLONOSCOPY N/A 01/22/2019   Procedure: COLONOSCOPY;  Surgeon: Rogene Houston, MD;  [facility];  Service: Endoscopy;  Laterality: N/A;  9:30  . EYE SURGERY Left    Vitrectomy, bilateral cataracts  . FRACTURE SURGERY Left    lower leg/ankle  . HEMORRHOID SURGERY  2007?  Marland Kitchen PARS PLANA VITRECTOMY Right 12/10/2013   Procedure: PARS PLANA VITRECTOMY 25 GAUGE WITH ENDOLASER RIGHT EYE ;  Surgeon: [name], MD;  Location: [place];  Service: Ophthalmology;  Laterality: Right;  . POLYPECTOMY  01/22/2019   Procedure: POLYPECTOMY;  Surgeon: Rogene Houston, MD;  [facility];  Service: Endoscopy;;  colon    FAMILY HISTORY History reviewed. No pertinent family history.  SOCIAL  HISTORY Social History   Tobacco Use  . Smoking status: Former Smoker    Types: Cigarettes    Quit date: 12/11/1987    Years since quitting: 32.4  . Smokeless tobacco: Never Used  Vaping Use  . Vaping Use: Never used  Substance Use Topics  . Alcohol use: Yes    Comment: occasional  . Drug use: No         OPHTHALMIC EXAM:  Base Eye Exam    Visual Acuity (Snellen - Linear)      Right Left   Dist [place] 20/20-2 20/25-1       Tonometry (Tonopen, 9:35 AM)      Right Left   Pressure 17 17       Pupils      Pupils Dark Light Shape React APD   Right PERRL 6 5 Round Brisk None   Left PERRL 6 5 Round Brisk None       Visual Fields (Counting fingers)      Left Right    Full Full       Extraocular Movement      Right Left    Full Full       Neuro/Psych    Oriented x3: Yes   Mood/Affect: Normal       Dilation    Right eye: 1.0% Mydriacyl, 2.5% Phenylephrine @ 9:35 AM        Slit Lamp and Fundus Exam    External Exam      Right Left   External Normal Normal       Slit Lamp Exam      Right Left   Lids/Lashes Normal Normal   Conjunctiva/Sclera White and quiet White and quiet   Cornea Clear Clear   Anterior Chamber Deep and quiet Deep and quiet   Iris Round and reactive Round and reactive   Lens Posterior chamber intraocular lens Posterior chamber intraocular lens   Vitreous Normal Normal          IMAGING AND PROCEDURES  Imaging and Procedures for 06/01/20  OCT, Retina - OU - Both Eyes       Right Eye Quality was good. Scan locations included subfoveal. Central Foveal Thickness: 313. Progression has been stable. Findings include abnormal foveal contour.   Left Eye Quality was good. Scan locations included subfoveal. Central Foveal Thickness: 298. Progression has been stable. Findings include abnormal foveal contour.   Notes CSME, temporally OD, focal laser OD today       Focal Laser - OD - Right Eye       Time Out Confirmed correct  patient, procedure, site, and patient consented.   Anesthesia Topical anesthesia was used. Anesthetic medications included Proparacaine 0.5%.  Laser Information The type of laser was diode. Color was yellow. The duration in seconds was 0.07. The spot size was 100 microns. Laser power was 70. Total spots was 281.   Post-op The patient tolerated the procedure well. There were no complications. The patient received written and verbal post procedure care education.                 ASSESSMENT/PLAN:  No problem-specific Assessment & Plan notes found for this encounter.      ICD-10-CM   1. Diabetic macular edema of right eye with proliferative retinopathy associated with type 2 diabetes mellitus (HCC)  E11.3511 OCT, Retina - OU - Both Eyes    Focal Laser - OD - Right Eye    1.  2.  3.  Ophthalmic Meds Ordered this visit:  No orders of the defined types were placed in this encounter.      Return in about 4 months (around 10/02/2020) for DILATE OU, OCT.  There are no Patient Instructions on file for this visit.   Explained the diagnoses, plan, and follow up with the patient and they expressed understanding.  Patient expressed understanding of the importance of proper follow up care.   Clent Demark Demesha Boorman M.D. Diseases & Surgery of the Retina and Vitreous Retina & Diabetic [place] 06/01/20     Abbreviations: M myopia (nearsighted); A astigmatism; H hyperopia (farsighted); P presbyopia; Mrx spectacle prescription;  CTL contact lenses; OD right eye; OS left eye; OU both eyes  XT exotropia; ET esotropia; PEK punctate epithelial keratitis; PEE punctate epithelial erosions; DES dry eye syndrome; MGD meibomian gland dysfunction; ATs artificial tears; PFAT's preservative free artificial tears; [place] nuclear sclerotic cataract; PSC posterior subcapsular cataract; ERM epi-retinal membrane; PVD posterior vitreous detachment; RD retinal detachment; DM diabetes mellitus; DR diabetic  retinopathy; NPDR non-proliferative diabetic retinopathy; PDR proliferative diabetic retinopathy; CSME clinically significant macular edema; DME diabetic macular edema; dbh dot blot hemorrhages; CWS cotton wool spot; POAG primary open angle glaucoma; C/D cup-to-disc ratio; HVF humphrey visual field; GVF goldmann visual field; OCT optical coherence tomography; IOP intraocular pressure; BRVO Branch retinal vein occlusion; CRVO central retinal vein occlusion; CRAO central retinal artery occlusion; BRAO branch retinal artery occlusion; RT retinal tear; SB scleral buckle; PPV pars plana vitrectomy; VH Vitreous hemorrhage; PRP panretinal laser photocoagulation; IVK intravitreal kenalog; VMT vitreomacular traction; MH Macular hole;  NVD neovascularization of the disc; NVE neovascularization elsewhere; AREDS age related eye disease study; ARMD age related macular degeneration; POAG primary open angle glaucoma; EBMD epithelial/anterior basement membrane dystrophy; ACIOL anterior chamber intraocular lens; IOL intraocular lens; PCIOL posterior chamber intraocular lens; Phaco/IOL phacoemulsification with intraocular lens placement; [place] photorefractive keratectomy; LASIK laser assisted in situ keratomileusis; HTN hypertension; DM diabetes mellitus; COPD chronic obstructive pulmonary disease

## 2020-10-04 ENCOUNTER — Other Ambulatory Visit: Payer: Self-pay

## 2020-10-04 ENCOUNTER — Ambulatory Visit (INDEPENDENT_AMBULATORY_CARE_PROVIDER_SITE_OTHER): Payer: Medicare Other | Admitting: Ophthalmology

## 2020-10-04 ENCOUNTER — Encounter (INDEPENDENT_AMBULATORY_CARE_PROVIDER_SITE_OTHER): Payer: Self-pay | Admitting: Ophthalmology

## 2020-10-04 DIAGNOSIS — E113511 Type 2 diabetes mellitus with proliferative diabetic retinopathy with macular edema, right eye: Secondary | ICD-10-CM

## 2020-10-04 DIAGNOSIS — E113552 Type 2 diabetes mellitus with stable proliferative diabetic retinopathy, left eye: Secondary | ICD-10-CM

## 2020-10-04 MED ORDER — BEVACIZUMAB CHEMO INJECTION 1.25MG/0.05ML SYRINGE FOR KALEIDOSCOPE
1.2500 mg | INTRAVITREAL | Status: AC | PRN
  Administered 2020-10-04: 1.25 mg via INTRAVITREAL

## 2020-10-04 NOTE — Assessment & Plan Note (Signed)

## 2020-10-04 NOTE — Progress Notes (Signed)
10/04/2020     CHIEF COMPLAINT Patient presents for Retina Follow Up   HISTORY OF PRESENT ILLNESS: Marc Haynes is a 69 y.o. male who presents to the clinic today for:   HPI    Retina Follow Up    Patient presents with  Diabetic Retinopathy.  In both eyes.  Severity is moderate.  Duration of 4 months.  Since onset it is stable.  I, the attending physician,  performed the HPI with the patient and updated documentation appropriately.          Comments    4 Month PDR f\u OU. OCT  Pt states vision is the same. Denies any complaints. BGL: 111 A1C: 6.9       Last edited by Tilda Franco on 10/04/2020 10:35 AM. (History)      Referring physician: [name], MD [address] [place],  [place] [address]  HISTORICAL INFORMATION:   Selected notes from the [facility]: No current outpatient medications on file. (Ophthalmic Drugs)   No current facility-administered medications for this visit. (Ophthalmic Drugs)   Current Outpatient Medications (Other)  Medication Sig  . acetaminophen (TYLENOL) 500 MG tablet Take 1,000 mg by mouth every 6 (six) hours as needed (for pain.).  Marland Kitchen citalopram (CELEXA) 40 MG tablet Take 40 mg by mouth every evening.  . clopidogrel (PLAVIX) 75 MG tablet Take 75 mg by mouth every evening.   . diltiazem (TIAZAC) 240 MG 24 hr capsule Take 240 mg by mouth every evening.   . ezetimibe (ZETIA) 10 MG tablet Take 10 mg by mouth every evening.   . hydrochlorothiazide (MICROZIDE) 12.5 MG capsule Take 12.5 mg by mouth every evening.   [name] Ethyl (VASCEPA) 1 g CAPS Take 2 g by mouth 2 (two) times daily.   . Insulin Degludec (TRESIBA FLEXTOUCH) 200 UNIT/ML SOPN Inject 120 Units into the skin daily.   Marland Kitchen losartan (COZAAR) 100 MG tablet Take 100 mg by mouth daily.  . metFORMIN (GLUCOPHAGE-XR) 500 MG 24 hr tablet Take 2,000 mg by mouth every evening.  . Multiple Vitamin (MULTIVITAMIN WITH MINERALS) TABS tablet Take 1  tablet by mouth every evening.   Marland Kitchen NOVOLOG FLEXPEN 100 UNIT/ML FlexPen Inject 8-30 Units into the skin 5 (five) times daily as needed. Sliding Scale Insulin  . pantoprazole (PROTONIX) 40 MG tablet Take 40 mg by mouth every evening.  . rosuvastatin (CRESTOR) 20 MG tablet Take 20 mg by mouth every evening.   Marland Kitchen VICTOZA 18 MG/3ML SOPN Inject 1.8 mg into the skin every evening.    No current facility-administered medications for this visit. (Other)      REVIEW OF SYSTEMS: ROS    Positive for: Endocrine   Last edited by Tilda Franco on 10/04/2020 10:35 AM. (History)       ALLERGIES Allergies  Allergen Reactions  . Cortisone Other (See Comments)    Pancreatitis   . Invokana [Canagliflozin] Other (See Comments)    Patient does not tolerate (hospitalized)     PAST MEDICAL HISTORY Past Medical History:  Diagnosis Date  . Diabetes mellitus without complication ([place])   . Eye abnormality    edema of eye .  has had laser surgeries.   . Hypercholesteremia   . Hypertension   . Sleep apnea    does not use CPAP  . Stroke [facility]) 2011   Past Surgical History:  Procedure Laterality Date  . APPENDECTOMY    .  CHOLECYSTECTOMY    . COLONOSCOPY    . COLONOSCOPY N/A 01/22/2019   Procedure: COLONOSCOPY;  Surgeon: Rogene Houston, MD;  [facility];  Service: Endoscopy;  Laterality: N/A;  9:30  . EYE SURGERY Left    Vitrectomy, bilateral cataracts  . FRACTURE SURGERY Left    lower leg/ankle  . HEMORRHOID SURGERY  2007?  Marland Kitchen PARS PLANA VITRECTOMY Right 12/10/2013   Procedure: PARS PLANA VITRECTOMY 25 GAUGE WITH ENDOLASER RIGHT EYE ;  Surgeon: [name], MD;  Location: [place];  Service: Ophthalmology;  Laterality: Right;  . POLYPECTOMY  01/22/2019   Procedure: POLYPECTOMY;  Surgeon: Rogene Houston, MD;  [facility];  Service: Endoscopy;;  colon    FAMILY HISTORY History reviewed. No pertinent family history.  SOCIAL HISTORY Social History   Tobacco Use  .  Smoking status: Former Smoker    Types: Cigarettes    Quit date: 12/11/1987    Years since quitting: 32.8  . Smokeless tobacco: Never Used  Vaping Use  . Vaping Use: Never used  Substance Use Topics  . Alcohol use: Yes    Comment: occasional  . Drug use: No         OPHTHALMIC EXAM: Base Eye Exam    Visual Acuity (Snellen - Linear)      Right Left   Dist [place] 20/25 20/25 -1       Tonometry (Tonopen, 10:39 AM)      Right Left   Pressure 11 9       Pupils      Pupils Dark Light Shape React APD   Right PERRL 6 5 Round Brisk None   Left PERRL 6 5 Round Brisk None       Visual Fields (Counting fingers)      Left Right    Full Full       Neuro/Psych    Oriented x3: Yes   Mood/Affect: Normal       Dilation    Both eyes: 1.0% Mydriacyl, 2.5% Phenylephrine @ 10:39 AM        Slit Lamp and Fundus Exam    External Exam      Right Left   External Normal Normal       Slit Lamp Exam      Right Left   Lids/Lashes Normal Normal   Conjunctiva/Sclera White and quiet White and quiet   Cornea Clear Clear   Anterior Chamber Deep and quiet Deep and quiet   Iris Round and reactive Round and reactive   Lens Posterior chamber intraocular lens Posterior chamber intraocular lens   Anterior Vitreous Normal Normal       Fundus Exam      Right Left   Posterior Vitreous Normal Normal   Disc Normal Normal   C/D Ratio 0.65 0.5   Macula Microaneurysms, Macular thickening, temporal aspect of the fovea in the region of previous capillary dropout noted in angiography performed February 2021 Microaneurysms, no exudates, no macular thickening   Vessels  PDR-quiet PDR-quiet   Periphery Normal Normal          IMAGING AND PROCEDURES  Imaging and Procedures for 10/04/20  OCT, Retina - OU - Both Eyes       Right Eye Quality was good. Scan locations included subfoveal. Central Foveal Thickness: 384. Progression has worsened. Findings include abnormal foveal contour.   Left  Eye Quality was good. Scan locations included subfoveal. Central Foveal Thickness: 285. Progression has been stable.  Findings include normal foveal contour.   Notes CSME noncenter involved OD, now a little over 3 months post focal, worse despite focal treatment  Will need intravitreal Avastin OD.       Intravitreal Injection, Pharmacologic Agent - OD - Right Eye       Time Out 10/04/2020. 11:00 AM. Confirmed correct patient, procedure, site, and patient consented.   Anesthesia Topical anesthesia was used. Anesthetic medications included [place] 3.5%.   Procedure Preparation included Tobramycin 0.3%, 10% betadine to eyelids, 5% betadine to ocular surface. A 30 gauge needle was used.   Injection:  1.25 mg Bevacizumab (AVASTIN) SOLN   NDC: 70360-001-02, Lot: [id number]   Route: Intravitreal, Site: Right Eye, Waste: 0 mg  Post-op Post injection exam found visual acuity of at least counting fingers. The patient tolerated the procedure well. There were no complications. The patient received written and verbal post procedure care education. Post injection medications were not given.                 ASSESSMENT/PLAN:  No problem-specific Assessment & Plan notes found for this encounter.      ICD-10-CM   1. Diabetic macular edema of right eye with proliferative retinopathy associated with type 2 diabetes mellitus (HCC)  E11.3511 OCT, Retina - OU - Both Eyes    Intravitreal Injection, Pharmacologic Agent - OD - Right Eye    Bevacizumab (AVASTIN) SOLN 1.25 mg    1.  CSME temporal to the fovea right eye has continued to progress, will repeat intravitreal Avastin today and examination in 8 weeks  2.  3.  Ophthalmic Meds Ordered this visit:  Meds ordered this encounter  Medications  . Bevacizumab (AVASTIN) SOLN 1.25 mg       Return in about 8 weeks (around 11/29/2020) for dilate, OD, AVASTIN OCT.  There are no Patient Instructions on file for this visit.   Explained the  diagnoses, plan, and follow up with the patient and they expressed understanding.  Patient expressed understanding of the importance of proper follow up care.   Clent Demark Johnnathan Hagemeister M.D. Diseases & Surgery of the Retina and Vitreous Retina & Diabetic [place] 10/04/20     Abbreviations: M myopia (nearsighted); A astigmatism; H hyperopia (farsighted); P presbyopia; Mrx spectacle prescription;  CTL contact lenses; OD right eye; OS left eye; OU both eyes  XT exotropia; ET esotropia; PEK punctate epithelial keratitis; PEE punctate epithelial erosions; DES dry eye syndrome; MGD meibomian gland dysfunction; ATs artificial tears; PFAT's preservative free artificial tears; [place] nuclear sclerotic cataract; PSC posterior subcapsular cataract; ERM epi-retinal membrane; PVD posterior vitreous detachment; RD retinal detachment; DM diabetes mellitus; DR diabetic retinopathy; NPDR non-proliferative diabetic retinopathy; PDR proliferative diabetic retinopathy; CSME clinically significant macular edema; DME diabetic macular edema; dbh dot blot hemorrhages; CWS cotton wool spot; POAG primary open angle glaucoma; C/D cup-to-disc ratio; HVF humphrey visual field; GVF goldmann visual field; OCT optical coherence tomography; IOP intraocular pressure; BRVO Branch retinal vein occlusion; CRVO central retinal vein occlusion; CRAO central retinal artery occlusion; BRAO branch retinal artery occlusion; RT retinal tear; SB scleral buckle; PPV pars plana vitrectomy; VH Vitreous hemorrhage; PRP panretinal laser photocoagulation; IVK intravitreal kenalog; VMT vitreomacular traction; MH Macular hole;  NVD neovascularization of the disc; NVE neovascularization elsewhere; AREDS age related eye disease study; ARMD age related macular degeneration; POAG primary open angle glaucoma; EBMD epithelial/anterior basement membrane dystrophy; ACIOL anterior chamber intraocular lens; IOL intraocular lens; PCIOL posterior chamber intraocular lens;  Phaco/IOL phacoemulsification with intraocular lens placement;  [place] photorefractive keratectomy; LASIK laser assisted in situ keratomileusis; HTN hypertension; DM diabetes mellitus; COPD chronic obstructive pulmonary disease

## 2020-11-29 ENCOUNTER — Encounter (INDEPENDENT_AMBULATORY_CARE_PROVIDER_SITE_OTHER): Payer: Medicare Other | Admitting: Ophthalmology

## 2020-12-20 ENCOUNTER — Other Ambulatory Visit: Payer: Self-pay

## 2020-12-20 ENCOUNTER — Ambulatory Visit (INDEPENDENT_AMBULATORY_CARE_PROVIDER_SITE_OTHER): Payer: Medicare Other | Admitting: Ophthalmology

## 2020-12-20 ENCOUNTER — Encounter (INDEPENDENT_AMBULATORY_CARE_PROVIDER_SITE_OTHER): Payer: Self-pay | Admitting: Ophthalmology

## 2020-12-20 DIAGNOSIS — G4733 Obstructive sleep apnea (adult) (pediatric): Secondary | ICD-10-CM | POA: Insufficient documentation

## 2020-12-20 DIAGNOSIS — E113511 Type 2 diabetes mellitus with proliferative diabetic retinopathy with macular edema, right eye: Secondary | ICD-10-CM

## 2020-12-20 DIAGNOSIS — H35071 Retinal telangiectasis, right eye: Secondary | ICD-10-CM

## 2020-12-20 DIAGNOSIS — E113552 Type 2 diabetes mellitus with stable proliferative diabetic retinopathy, left eye: Secondary | ICD-10-CM

## 2020-12-20 MED ORDER — BEVACIZUMAB 2.5 MG/0.1ML IZ SOSY
2.5000 mg | PREFILLED_SYRINGE | INTRAVITREAL | Status: AC | PRN
  Administered 2020-12-20: 2.5 mg via INTRAVITREAL

## 2020-12-20 NOTE — Progress Notes (Signed)
12/20/2020     CHIEF COMPLAINT Patient presents for Retina Follow Up (10 WK FU OD, POSS AVASTIN OD///Pt reports blurry vision onset within the last 3 days OU, pt denies new F/F OU, pt denies any pain or pressure OU.////Last A1C: around 6.8 taken 07/2020///Last BS: 105 this AM)   HISTORY OF PRESENT ILLNESS: Marc Haynes is a 70 y.o. male who presents to the clinic today for:   HPI    Retina Follow Up    Patient presents with  Diabetic Retinopathy.  This started 10 weeks ago.  Duration of 10 weeks. Additional comments: 10 WK FU OD, POSS AVASTIN OD   Pt reports blurry vision onset within the last 3 days OU, pt denies new F/F OU, pt denies any pain or pressure OU.    Last A1C: around 6.8 taken 07/2020   Last BS: 105 this AM       Last edited by Nichola Sizer D on 12/20/2020  9:31 AM. (History)      Referring physician: [name], MD [place],  [place] [address]  HISTORICAL INFORMATION:   Selected notes from the MEDICAL RECORD NUMBER       CURRENT MEDICATIONS: No current outpatient medications on file. (Ophthalmic Drugs)   No current facility-administered medications for this visit. (Ophthalmic Drugs)   Current Outpatient Medications (Other)  Medication Sig  . acetaminophen (TYLENOL) 500 MG tablet Take 1,000 mg by mouth every 6 (six) hours as needed (for pain.).  Marland Kitchen citalopram (CELEXA) 40 MG tablet Take 40 mg by mouth every evening.  . clopidogrel (PLAVIX) 75 MG tablet Take 75 mg by mouth every evening.   . diltiazem (TIAZAC) 240 MG 24 hr capsule Take 240 mg by mouth every evening.   . ezetimibe (ZETIA) 10 MG tablet Take 10 mg by mouth every evening.   . hydrochlorothiazide (MICROZIDE) 12.5 MG capsule Take 12.5 mg by mouth every evening.   [name] Ethyl (VASCEPA) 1 g CAPS Take 2 g by mouth 2 (two) times daily.   . Insulin Degludec (TRESIBA FLEXTOUCH) 200 UNIT/ML SOPN Inject 120 Units into the skin daily.   Marland Kitchen losartan (COZAAR) 100 MG tablet Take 100  mg by mouth daily.  . metFORMIN (GLUCOPHAGE-XR) 500 MG 24 hr tablet Take 2,000 mg by mouth every evening.  . Multiple Vitamin (MULTIVITAMIN WITH MINERALS) TABS tablet Take 1 tablet by mouth every evening.   Marland Kitchen NOVOLOG FLEXPEN 100 UNIT/ML FlexPen Inject 8-30 Units into the skin 5 (five) times daily as needed. Sliding Scale Insulin  . pantoprazole (PROTONIX) 40 MG tablet Take 40 mg by mouth every evening.  . rosuvastatin (CRESTOR) 20 MG tablet Take 20 mg by mouth every evening.   Marland Kitchen VICTOZA 18 MG/3ML SOPN Inject 1.8 mg into the skin every evening.    No current facility-administered medications for this visit. (Other)      REVIEW OF SYSTEMS:    ALLERGIES Allergies  Allergen Reactions  . Cortisone Other (See Comments)    Pancreatitis   . Invokana [Canagliflozin] Other (See Comments)    Patient does not tolerate (hospitalized)     PAST MEDICAL HISTORY Past Medical History:  Diagnosis Date  . Diabetes mellitus without complication ([place])   . Eye abnormality    edema of eye .  has had laser surgeries.   . Hypercholesteremia   . Hypertension   . Sleep apnea    does not use CPAP  . Stroke [facility]) 2011   Past Surgical History:  Procedure Laterality Date  . APPENDECTOMY    . CHOLECYSTECTOMY    . COLONOSCOPY    . COLONOSCOPY N/A 01/22/2019   Procedure: COLONOSCOPY;  Surgeon: Rogene [place], MD;  [facility];  Service: Endoscopy;  Laterality: N/A;  9:30  . EYE SURGERY Left    Vitrectomy, bilateral cataracts  . FRACTURE SURGERY Left    lower leg/ankle  . HEMORRHOID SURGERY  2007?  Marland Kitchen PARS PLANA VITRECTOMY Right 12/10/2013   Procedure: PARS PLANA VITRECTOMY 25 GAUGE WITH ENDOLASER RIGHT EYE ;  Surgeon: [name], MD;  Location: [place];  Service: Ophthalmology;  Laterality: Right;  . POLYPECTOMY  01/22/2019   Procedure: POLYPECTOMY;  Surgeon: Rogene [place], MD;  [facility];  Service: Endoscopy;;  colon    FAMILY HISTORY History reviewed. No pertinent  family history.  SOCIAL HISTORY Social History   Tobacco Use  . Smoking status: Former Smoker    Types: Cigarettes    Quit date: 12/11/1987    Years since quitting: 33.0  . Smokeless tobacco: Never Used  Vaping Use  . Vaping Use: Never used  Substance Use Topics  . Alcohol use: Yes    Comment: occasional  . Drug use: No         OPHTHALMIC EXAM:  Base Eye Exam    Visual Acuity (ETDRS)      Right Left   Dist [place] 20/40 -2 20/30 +2   Dist ph [place] NI 20/25 -1       Tonometry (Tonopen, 9:39 AM)      Right Left   Pressure 16 17       Pupils      Pupils Dark Light Shape React APD   Right PERRL 6 5 Round Brisk None   Left PERRL 6 5 Round Brisk None       Visual Fields (Counting fingers)      Left Right    Full Full       Extraocular Movement      Right Left    Full Full       Neuro/Psych    Oriented x3: Yes   Mood/Affect: Normal       Dilation    Right eye: 1.0% Mydriacyl, 2.5% Phenylephrine @ 9:39 AM        Slit Lamp and Fundus Exam    External Exam      Right Left   External Normal Normal       Slit Lamp Exam      Right Left   Lids/Lashes Normal Normal   Conjunctiva/Sclera White and quiet White and quiet   Cornea Clear Clear   Anterior Chamber Deep and quiet Deep and quiet   Iris Round and reactive Round and reactive   Lens Posterior chamber intraocular lens Posterior chamber intraocular lens   Anterior Vitreous Normal Normal       Fundus Exam      Right Left   Posterior Vitreous Normal Normal   Disc Normal Normal   C/D Ratio 0.65 0.5   Macula Microaneurysms, Macular thickening, temporal aspect of the fovea in the region of previous capillary dropout noted in angiography performed February 2021 Microaneurysms, no exudates, no macular thickening   Vessels  PDR-quiet PDR-quiet   Periphery Normal Normal          IMAGING AND PROCEDURES  Imaging and Procedures for 12/20/20  OCT, Retina - OU - Both Eyes       Right Eye Quality  was  good. Scan locations included subfoveal. Central Foveal Thickness: 468. Progression has worsened. Findings include abnormal foveal contour, cystoid macular edema.   Left Eye Quality was good. Scan locations included subfoveal. Central Foveal Thickness: 292. Progression has been stable. Findings include normal foveal contour, cystoid macular edema.   Notes CSME noncenter involved OD, now a little over 3.5 months post last injection Avastin  Will need intravitreal Avastin OD, repeated today and also resume good fit with CPAP  OS watch temporal aspect with minor CME increasing likely related to hypoxia of sleep apnea recently       Intravitreal Injection, Pharmacologic Agent - OD - Right Eye       Time Out 12/20/2020. 10:04 AM. Confirmed correct patient, procedure, site, and patient consented.   Anesthesia Topical anesthesia was used. Anesthetic medications included [place] 3.5%.   Procedure Preparation included Tobramycin 0.3%, 10% betadine to eyelids, 5% betadine to ocular surface. A 30 gauge needle was used.   Injection:  2.5 mg Bevacizumab (AVASTIN) 2.5mg /0.27mL SOSY   NDC: 70623-762-83, Lot: [id number]   Route: Intravitreal, Site: Right Eye  Post-op Post injection exam found visual acuity of at least counting fingers. The patient tolerated the procedure well. There were no complications. The patient received written and verbal post procedure care education. Post injection medications were not given.                 ASSESSMENT/PLAN:  Retinal telangiectasis of right eye Patient has been doing very well on use of CPAP once instructed to resume its use now.  However the last 2 weeks has had poor mask fit and is awaiting new supplies. On CPAP,, has poor mask fit for last 2 weeks, coincident with blurred vision for 4 days now.      ICD-10-CM   1. Diabetic macular edema of right eye with proliferative retinopathy associated with type 2 diabetes mellitus (HCC)  E11.3511 OCT,  Retina - OU - Both Eyes    Intravitreal Injection, Pharmacologic Agent - OD - Right Eye    bevacizumab (AVASTIN) SOSY 2.5 mg  2. Stable treated proliferative diabetic retinopathy of left eye determined by examination associated with type 2 diabetes mellitus ([place])  W73.7106 OCT, Retina - OU - Both Eyes  3. Obstructive sleep apnea hypopnea, severe  G47.33   4. Retinal telangiectasis of right eye  H35.071     1.  CSME OD worse today at 3.5 months but also coincident with poor mask, CPAP, fit for the last 1 to 2 weeks.    2.  Repeat injection Avastin OD today  3.  Will watch temporal aspect of the macula left eye, with minor CME temporally could likely improve with CPAP restitution  Ophthalmic Meds Ordered this visit:  Meds ordered this encounter  Medications  . bevacizumab (AVASTIN) SOSY 2.5 mg       Return in about 8 weeks (around 02/14/2021) for DILATE OU, AVASTIN OCT, OD.  There are no Patient Instructions on file for this visit.   Explained the diagnoses, plan, and follow up with the patient and they expressed understanding.  Patient expressed understanding of the importance of proper follow up care.   Clent Demark Rankin M.D. Diseases & Surgery of the Retina and Vitreous Retina & Diabetic [place] 12/20/20     Abbreviations: M myopia (nearsighted); A astigmatism; H hyperopia (farsighted); P presbyopia; Mrx spectacle prescription;  CTL contact lenses; OD right eye; OS left eye; OU both eyes  XT exotropia; ET esotropia; PEK  punctate epithelial keratitis; PEE punctate epithelial erosions; DES dry eye syndrome; MGD meibomian gland dysfunction; ATs artificial tears; PFAT's preservative free artificial tears; [place] nuclear sclerotic cataract; PSC posterior subcapsular cataract; ERM epi-retinal membrane; PVD posterior vitreous detachment; RD retinal detachment; DM diabetes mellitus; DR diabetic retinopathy; NPDR non-proliferative diabetic retinopathy; PDR proliferative diabetic  retinopathy; CSME clinically significant macular edema; DME diabetic macular edema; dbh dot blot hemorrhages; CWS cotton wool spot; POAG primary open angle glaucoma; C/D cup-to-disc ratio; HVF humphrey visual field; GVF goldmann visual field; OCT optical coherence tomography; IOP intraocular pressure; BRVO Branch retinal vein occlusion; CRVO central retinal vein occlusion; CRAO central retinal artery occlusion; BRAO branch retinal artery occlusion; RT retinal tear; SB scleral buckle; PPV pars plana vitrectomy; VH Vitreous hemorrhage; PRP panretinal laser photocoagulation; IVK intravitreal kenalog; VMT vitreomacular traction; MH Macular hole;  NVD neovascularization of the disc; NVE neovascularization elsewhere; AREDS age related eye disease study; ARMD age related macular degeneration; POAG primary open angle glaucoma; EBMD epithelial/anterior basement membrane dystrophy; ACIOL anterior chamber intraocular lens; IOL intraocular lens; PCIOL posterior chamber intraocular lens; Phaco/IOL phacoemulsification with intraocular lens placement; [place] photorefractive keratectomy; LASIK laser assisted in situ keratomileusis; HTN hypertension; DM diabetes mellitus; COPD chronic obstructive pulmonary disease

## 2020-12-20 NOTE — Assessment & Plan Note (Signed)
Patient has been doing very well on use of CPAP once instructed to resume its use now.  However the last 2 weeks has had poor mask fit and is awaiting new supplies. On CPAP,, has poor mask fit for last 2 weeks, coincident with blurred vision for 4 days now.

## 2020-12-29 DIAGNOSIS — E1165 Type 2 diabetes mellitus with hyperglycemia: Secondary | ICD-10-CM | POA: Diagnosis not present

## 2020-12-29 DIAGNOSIS — G4700 Insomnia, unspecified: Secondary | ICD-10-CM | POA: Diagnosis not present

## 2020-12-29 DIAGNOSIS — F321 Major depressive disorder, single episode, moderate: Secondary | ICD-10-CM | POA: Diagnosis not present

## 2020-12-29 DIAGNOSIS — I10 Essential (primary) hypertension: Secondary | ICD-10-CM | POA: Diagnosis not present

## 2020-12-29 DIAGNOSIS — K219 Gastro-esophageal reflux disease without esophagitis: Secondary | ICD-10-CM | POA: Diagnosis not present

## 2020-12-29 DIAGNOSIS — Z299 Encounter for prophylactic measures, unspecified: Secondary | ICD-10-CM | POA: Diagnosis not present

## 2021-02-14 ENCOUNTER — Ambulatory Visit (INDEPENDENT_AMBULATORY_CARE_PROVIDER_SITE_OTHER): Payer: Medicare Other | Admitting: Ophthalmology

## 2021-02-14 ENCOUNTER — Other Ambulatory Visit: Payer: Self-pay

## 2021-02-14 ENCOUNTER — Encounter (INDEPENDENT_AMBULATORY_CARE_PROVIDER_SITE_OTHER): Payer: Self-pay | Admitting: Ophthalmology

## 2021-02-14 DIAGNOSIS — E113511 Type 2 diabetes mellitus with proliferative diabetic retinopathy with macular edema, right eye: Secondary | ICD-10-CM

## 2021-02-14 DIAGNOSIS — H35071 Retinal telangiectasis, right eye: Secondary | ICD-10-CM

## 2021-02-14 DIAGNOSIS — E113552 Type 2 diabetes mellitus with stable proliferative diabetic retinopathy, left eye: Secondary | ICD-10-CM | POA: Diagnosis not present

## 2021-02-14 MED ORDER — BEVACIZUMAB 2.5 MG/0.1ML IZ SOSY
2.5000 mg | PREFILLED_SYRINGE | INTRAVITREAL | Status: AC | PRN
  Administered 2021-02-14: 2.5 mg via INTRAVITREAL

## 2021-02-14 NOTE — Assessment & Plan Note (Signed)
Patient continues on excellent compliance with CPAP and mask fitting thus this component of disease is not worsening and also stabilizing allowing for Avastin to work on the clinically significant macular edema component of maculopathy OD

## 2021-02-14 NOTE — Assessment & Plan Note (Signed)
CSME is improved today at 8-week follow-up interval, repeat injection today and examination next in 9 weeks

## 2021-02-14 NOTE — Assessment & Plan Note (Signed)

## 2021-02-14 NOTE — Progress Notes (Signed)
+    02/14/2021     CHIEF COMPLAINT Patient presents for Retina Follow Up (8 Week PDR f\u. Possible Avastin OD. OCT/Pt states vision is stable. Pt states he has to blink OU a lot./BGL: 147/A1C: 6.8)   HISTORY OF PRESENT ILLNESS: Marc Haynes is a 70 y.o. male who presents to the clinic today for:   HPI    Retina Follow Up    Patient presents with  Diabetic Retinopathy.  In right eye.  Severity is moderate.  Duration of 8.  Since onset it is stable.  I, the attending physician,  performed the HPI with the patient and updated documentation appropriately. Additional comments: 8 Week PDR f\u. Possible Avastin OD. OCT Pt states vision is stable. Pt states he has to blink OU a lot. BGL: 147 A1C: 6.8       Last edited by Tilda Franco on 02/14/2021  9:09 AM. (History)      Referring physician: [name], MD [place],  [place] [address]  HISTORICAL INFORMATION:   Selected notes from the MEDICAL RECORD NUMBER       CURRENT MEDICATIONS: No current outpatient medications on file. (Ophthalmic Drugs)   No current facility-administered medications for this visit. (Ophthalmic Drugs)   Current Outpatient Medications (Other)  Medication Sig  . acetaminophen (TYLENOL) 500 MG tablet Take 1,000 mg by mouth every 6 (six) hours as needed (for pain.).  Marland Kitchen citalopram (CELEXA) 40 MG tablet Take 40 mg by mouth every evening.  . clopidogrel (PLAVIX) 75 MG tablet Take 75 mg by mouth every evening.   . diltiazem (TIAZAC) 240 MG 24 hr capsule Take 240 mg by mouth every evening.   . ezetimibe (ZETIA) 10 MG tablet Take 10 mg by mouth every evening.   . hydrochlorothiazide (MICROZIDE) 12.5 MG capsule Take 12.5 mg by mouth every evening.   [name] Ethyl (VASCEPA) 1 g CAPS Take 2 g by mouth 2 (two) times daily.   . Insulin Degludec (TRESIBA FLEXTOUCH) 200 UNIT/ML SOPN Inject 120 Units into the skin daily.   Marland Kitchen losartan (COZAAR) 100 MG tablet Take 100 mg by mouth daily.  . metFORMIN  (GLUCOPHAGE-XR) 500 MG 24 hr tablet Take 2,000 mg by mouth every evening.  . Multiple Vitamin (MULTIVITAMIN WITH MINERALS) TABS tablet Take 1 tablet by mouth every evening.   Marland Kitchen NOVOLOG FLEXPEN 100 UNIT/ML FlexPen Inject 8-30 Units into the skin 5 (five) times daily as needed. Sliding Scale Insulin  . pantoprazole (PROTONIX) 40 MG tablet Take 40 mg by mouth every evening.  . rosuvastatin (CRESTOR) 20 MG tablet Take 20 mg by mouth every evening.   Marland Kitchen VICTOZA 18 MG/3ML SOPN Inject 1.8 mg into the skin every evening.    No current facility-administered medications for this visit. (Other)      REVIEW OF SYSTEMS: ROS    Positive for: Endocrine   Last edited by Tilda Franco on 02/14/2021  9:09 AM. (History)       ALLERGIES Allergies  Allergen Reactions  . Cortisone Other (See Comments)    Pancreatitis   . Invokana [Canagliflozin] Other (See Comments)    Patient does not tolerate (hospitalized)     PAST MEDICAL HISTORY Past Medical History:  Diagnosis Date  . Diabetes mellitus without complication ([place])   . Eye abnormality    edema of eye .  has had laser surgeries.   . Hypercholesteremia   . Hypertension   . Sleep apnea    does not  use CPAP  . Stroke [facility]) 2011   Past Surgical History:  Procedure Laterality Date  . APPENDECTOMY    . CHOLECYSTECTOMY    . COLONOSCOPY    . COLONOSCOPY N/A 01/22/2019   Procedure: COLONOSCOPY;  Surgeon: Rogene Houston, MD;  [facility];  Service: Endoscopy;  Laterality: N/A;  9:30  . EYE SURGERY Left    Vitrectomy, bilateral cataracts  . FRACTURE SURGERY Left    lower leg/ankle  . HEMORRHOID SURGERY  2007?  Marland Kitchen PARS PLANA VITRECTOMY Right 12/10/2013   Procedure: PARS PLANA VITRECTOMY 25 GAUGE WITH ENDOLASER RIGHT EYE ;  Surgeon: [name], MD;  Location: [place];  Service: Ophthalmology;  Laterality: Right;  . POLYPECTOMY  01/22/2019   Procedure: POLYPECTOMY;  Surgeon: Rogene Houston, MD;  [facility];   Service: Endoscopy;;  colon    FAMILY HISTORY History reviewed. No pertinent family history.  SOCIAL HISTORY Social History   Tobacco Use  . Smoking status: Former Smoker    Types: Cigarettes    Quit date: 12/11/1987    Years since quitting: 33.2  . Smokeless tobacco: Never Used  Vaping Use  . Vaping Use: Never used  Substance Use Topics  . Alcohol use: Yes    Comment: occasional  . Drug use: No         OPHTHALMIC EXAM:  Base Eye Exam    Visual Acuity (Snellen - Linear)      Right Left   Dist [place] 20/30 -1 20/30 -2       Tonometry (Tonopen, 9:13 AM)      Right Left   Pressure 16 13       Pupils      Pupils Dark Light Shape React APD   Right PERRL 5 4 Round Brisk None   Left PERRL 5 4 Round Brisk None       Visual Fields (Counting fingers)      Left Right    Full Full       Neuro/Psych    Oriented x3: Yes   Mood/Affect: Normal       Dilation    Both eyes: 1.0% Mydriacyl, 2.5% Phenylephrine @ 9:13 AM        Slit Lamp and Fundus Exam    External Exam      Right Left   External Normal Normal       Slit Lamp Exam      Right Left   Lids/Lashes Normal Normal   Conjunctiva/Sclera White and quiet White and quiet   Cornea Clear Clear   Anterior Chamber Deep and quiet Deep and quiet   Iris Round and reactive Round and reactive   Lens Posterior chamber intraocular lens Posterior chamber intraocular lens   Anterior Vitreous Normal Normal       Fundus Exam      Right Left   Posterior Vitreous Normal Normal   Disc Normal Normal   C/D Ratio 0.65 0.5   Macula Microaneurysms, Macular thickening, temporal aspect of the fovea in the region of previous capillary dropout noted in angiography performed February 2021 Microaneurysms, no exudates, no macular thickening   Vessels  PDR-quiet PDR-quiet   Periphery Normal Normal          IMAGING AND PROCEDURES  Imaging and Procedures for 02/14/21  OCT, Retina - OU - Both Eyes       Right Eye Quality  was good. Scan locations included subfoveal. Central Foveal Thickness: 380. Progression has  improved.   Left Eye Quality was good. Scan locations included subfoveal. Central Foveal Thickness: 292. Progression has been stable.   Notes Clinically significant macular edema the right eye has improved nicely some 8 weeks post Avastin.  Will need repeat injection today and examination again in a  to 9-week                ASSESSMENT/PLAN:  Diabetic macular edema of right eye with proliferative retinopathy associated with type 2 diabetes mellitus ([place]) CSME is improved today at 8-week follow-up interval, repeat injection today and examination next in 9 weeks  Retinal telangiectasis of right eye Patient continues on excellent compliance with CPAP and mask fitting thus this component of disease is not worsening and also stabilizing allowing for Avastin to work on the clinically significant macular edema component of maculopathy OD  Stable treated proliferative diabetic retinopathy of left eye determined by examination associated with type 2 diabetes mellitus ([place]) The nature of regressed proliferative diabetic retinopathy was discussed with the patient. The patient was advised to maintain good glucose, blood pressure, monitor kidney function and serum lipid control as advised by personal physician. Rare risk for reactivation of progression exist with untreated severe anemia, untreated renal failure, untreated heart failure, and smoking. Complete avoidance of smoking was recommended. The chance of recurrent proliferative diabetic retinopathy was discussed as well as the chance of vitreous hemorrhage for which further treatments may be necessary.   Explained to the patient that the quiescent  proliferative diabetic retinopathy disease is unlikely to ever worsen.  Worsening factors would include however severe anemia, hypertension out-of-control or impending renal failure.      ICD-10-CM   1.  Diabetic macular edema of right eye with proliferative retinopathy associated with type 2 diabetes mellitus (HCC)  E11.3511 OCT, Retina - OU - Both Eyes  2. Retinal telangiectasis of right eye  H35.071   3. Stable treated proliferative diabetic retinopathy of left eye determined by examination associated with type 2 diabetes mellitus ([place])  W58.0998     1.  PDR quiescent in each eye.  CSME center involved OD has improved nicely post Avastin, today at 8-week interval  2.  Patient continues on CPAP effectively,  in my opinion contributing to a nice stabilization of the retinal telangiectasis left eye and improving effective of Avastin use right eye for CSME  3.  Ophthalmic Meds Ordered this visit:  No orders of the defined types were placed in this encounter.      Return in about 9 weeks (around 04/18/2021) for dilate, OD, AVASTIN OCT.  There are no Patient Instructions on file for this visit.   Explained the diagnoses, plan, and follow up with the patient and they expressed understanding.  Patient expressed understanding of the importance of proper follow up care.   Clent Demark Rankin M.D. Diseases & Surgery of the Retina and Vitreous Retina & Diabetic [place] 02/14/21     Abbreviations: M myopia (nearsighted); A astigmatism; H hyperopia (farsighted); P presbyopia; Mrx spectacle prescription;  CTL contact lenses; OD right eye; OS left eye; OU both eyes  XT exotropia; ET esotropia; PEK punctate epithelial keratitis; PEE punctate epithelial erosions; DES dry eye syndrome; MGD meibomian gland dysfunction; ATs artificial tears; PFAT's preservative free artificial tears; [place] nuclear sclerotic cataract; PSC posterior subcapsular cataract; ERM epi-retinal membrane; PVD posterior vitreous detachment; RD retinal detachment; DM diabetes mellitus; DR diabetic retinopathy; NPDR non-proliferative diabetic retinopathy; PDR proliferative diabetic retinopathy; CSME clinically significant macular  edema; DME diabetic  macular edema; dbh dot blot hemorrhages; CWS cotton wool spot; POAG primary open angle glaucoma; C/D cup-to-disc ratio; HVF humphrey visual field; GVF goldmann visual field; OCT optical coherence tomography; IOP intraocular pressure; BRVO Branch retinal vein occlusion; CRVO central retinal vein occlusion; CRAO central retinal artery occlusion; BRAO branch retinal artery occlusion; RT retinal tear; SB scleral buckle; PPV pars plana vitrectomy; VH Vitreous hemorrhage; PRP panretinal laser photocoagulation; IVK intravitreal kenalog; VMT vitreomacular traction; MH Macular hole;  NVD neovascularization of the disc; NVE neovascularization elsewhere; AREDS age related eye disease study; ARMD age related macular degeneration; POAG primary open angle glaucoma; EBMD epithelial/anterior basement membrane dystrophy; ACIOL anterior chamber intraocular lens; IOL intraocular lens; PCIOL posterior chamber intraocular lens; Phaco/IOL phacoemulsification with intraocular lens placement; [place] photorefractive keratectomy; LASIK laser assisted in situ keratomileusis; HTN hypertension; DM diabetes mellitus; COPD chronic obstructive pulmonary disease

## 2021-03-09 DIAGNOSIS — E782 Mixed hyperlipidemia: Secondary | ICD-10-CM | POA: Diagnosis not present

## 2021-03-09 DIAGNOSIS — E1165 Type 2 diabetes mellitus with hyperglycemia: Secondary | ICD-10-CM | POA: Diagnosis not present

## 2021-03-09 DIAGNOSIS — Z794 Long term (current) use of insulin: Secondary | ICD-10-CM | POA: Diagnosis not present

## 2021-03-11 DIAGNOSIS — E782 Mixed hyperlipidemia: Secondary | ICD-10-CM | POA: Diagnosis not present

## 2021-03-11 DIAGNOSIS — E786 Lipoprotein deficiency: Secondary | ICD-10-CM | POA: Diagnosis not present

## 2021-03-11 DIAGNOSIS — I10 Essential (primary) hypertension: Secondary | ICD-10-CM | POA: Diagnosis not present

## 2021-03-11 DIAGNOSIS — Z7984 Long term (current) use of oral hypoglycemic drugs: Secondary | ICD-10-CM | POA: Diagnosis not present

## 2021-03-11 DIAGNOSIS — Z794 Long term (current) use of insulin: Secondary | ICD-10-CM | POA: Diagnosis not present

## 2021-03-11 DIAGNOSIS — E11319 Type 2 diabetes mellitus with unspecified diabetic retinopathy without macular edema: Secondary | ICD-10-CM | POA: Diagnosis not present

## 2021-03-11 DIAGNOSIS — R809 Proteinuria, unspecified: Secondary | ICD-10-CM | POA: Diagnosis not present

## 2021-03-11 DIAGNOSIS — G4700 Insomnia, unspecified: Secondary | ICD-10-CM | POA: Diagnosis not present

## 2021-03-11 DIAGNOSIS — N529 Male erectile dysfunction, unspecified: Secondary | ICD-10-CM | POA: Diagnosis not present

## 2021-03-11 DIAGNOSIS — E538 Deficiency of other specified B group vitamins: Secondary | ICD-10-CM | POA: Diagnosis not present

## 2021-03-11 DIAGNOSIS — I679 Cerebrovascular disease, unspecified: Secondary | ICD-10-CM | POA: Diagnosis not present

## 2021-03-11 DIAGNOSIS — Z79899 Other long term (current) drug therapy: Secondary | ICD-10-CM | POA: Diagnosis not present

## 2021-04-12 DIAGNOSIS — G4700 Insomnia, unspecified: Secondary | ICD-10-CM | POA: Diagnosis not present

## 2021-04-12 DIAGNOSIS — Z299 Encounter for prophylactic measures, unspecified: Secondary | ICD-10-CM | POA: Diagnosis not present

## 2021-04-12 DIAGNOSIS — K219 Gastro-esophageal reflux disease without esophagitis: Secondary | ICD-10-CM | POA: Diagnosis not present

## 2021-04-12 DIAGNOSIS — E1165 Type 2 diabetes mellitus with hyperglycemia: Secondary | ICD-10-CM | POA: Diagnosis not present

## 2021-04-12 DIAGNOSIS — D692 Other nonthrombocytopenic purpura: Secondary | ICD-10-CM | POA: Diagnosis not present

## 2021-04-12 DIAGNOSIS — I10 Essential (primary) hypertension: Secondary | ICD-10-CM | POA: Diagnosis not present

## 2021-04-18 ENCOUNTER — Ambulatory Visit (INDEPENDENT_AMBULATORY_CARE_PROVIDER_SITE_OTHER): Payer: Medicare Other | Admitting: Ophthalmology

## 2021-04-18 ENCOUNTER — Other Ambulatory Visit: Payer: Self-pay

## 2021-04-18 ENCOUNTER — Encounter (INDEPENDENT_AMBULATORY_CARE_PROVIDER_SITE_OTHER): Payer: Self-pay | Admitting: Ophthalmology

## 2021-04-18 DIAGNOSIS — E113552 Type 2 diabetes mellitus with stable proliferative diabetic retinopathy, left eye: Secondary | ICD-10-CM

## 2021-04-18 DIAGNOSIS — H35071 Retinal telangiectasis, right eye: Secondary | ICD-10-CM

## 2021-04-18 DIAGNOSIS — E113511 Type 2 diabetes mellitus with proliferative diabetic retinopathy with macular edema, right eye: Secondary | ICD-10-CM | POA: Diagnosis not present

## 2021-04-18 MED ORDER — BEVACIZUMAB 2.5 MG/0.1ML IZ SOSY
2.5000 mg | PREFILLED_SYRINGE | INTRAVITREAL | Status: AC | PRN
  Administered 2021-04-18: 2.5 mg via INTRAVITREAL

## 2021-04-18 NOTE — Assessment & Plan Note (Signed)
Focal thickening temporal aspect of the fovea has remained stable yet under control and yet still active at 9-week interval follow-up today

## 2021-04-18 NOTE — Progress Notes (Signed)
04/18/2021     CHIEF COMPLAINT Patient presents for Retina Follow Up (9 wk fu OD and Avastin OD/Pt states VA OU stable since last visit. Pt denies FOL, floaters, or ocular pain OU. Karie Mainland: 6.6/LBS: 143//)   HISTORY OF PRESENT ILLNESS: Marc Haynes is a 70 y.o. male who presents to the clinic today for:   HPI    Retina Follow Up    Diagnosis: Diabetic Retinopathy   Laterality: right eye   Onset: 9 weeks ago   Severity: mild   Duration: 9 weeks   Course: stable   Comments: 9 wk fu OD and Avastin OD Pt states VA OU stable since last visit. Pt denies FOL, floaters, or ocular pain OU.  A1C: 6.6 LBS: 143         Last edited by Kendra Opitz, COA on 04/18/2021  9:11 AM. (History)      Referring physician: [name], MD [address] [place],  [place] [address]  HISTORICAL INFORMATION:   Selected notes from the MEDICAL RECORD NUMBER       CURRENT MEDICATIONS: No current outpatient medications on file. (Ophthalmic Drugs)   No current facility-administered medications for this visit. (Ophthalmic Drugs)   Current Outpatient Medications (Other)  Medication Sig  . acetaminophen (TYLENOL) 500 MG tablet Take 1,000 mg by mouth every 6 (six) hours as needed (for pain.).  Marland Kitchen citalopram (CELEXA) 40 MG tablet Take 40 mg by mouth every evening.  . clopidogrel (PLAVIX) 75 MG tablet Take 75 mg by mouth every evening.   . diltiazem (TIAZAC) 240 MG 24 hr capsule Take 240 mg by mouth every evening.   . ezetimibe (ZETIA) 10 MG tablet Take 10 mg by mouth every evening.   . hydrochlorothiazide (MICROZIDE) 12.5 MG capsule Take 12.5 mg by mouth every evening.   [name] Ethyl (VASCEPA) 1 g CAPS Take 2 g by mouth 2 (two) times daily.   . Insulin Degludec (TRESIBA FLEXTOUCH) 200 UNIT/ML SOPN Inject 120 Units into the skin daily.   Marland Kitchen losartan (COZAAR) 100 MG tablet Take 100 mg by mouth daily.  . metFORMIN (GLUCOPHAGE-XR) 500 MG 24 hr tablet Take 2,000 mg by mouth every evening.  . Multiple  Vitamin (MULTIVITAMIN WITH MINERALS) TABS tablet Take 1 tablet by mouth every evening.   Marland Kitchen NOVOLOG FLEXPEN 100 UNIT/ML FlexPen Inject 8-30 Units into the skin 5 (five) times daily as needed. Sliding Scale Insulin  . pantoprazole (PROTONIX) 40 MG tablet Take 40 mg by mouth every evening.  . rosuvastatin (CRESTOR) 20 MG tablet Take 20 mg by mouth every evening.   Marland Kitchen VICTOZA 18 MG/3ML SOPN Inject 1.8 mg into the skin every evening.    No current facility-administered medications for this visit. (Other)      REVIEW OF SYSTEMS:    ALLERGIES Allergies  Allergen Reactions  . Cortisone Other (See Comments)    Pancreatitis   . Invokana [Canagliflozin] Other (See Comments)    Patient does not tolerate (hospitalized)     PAST MEDICAL HISTORY Past Medical History:  Diagnosis Date  . Diabetes mellitus without complication ([place])   . Eye abnormality    edema of eye .  has had laser surgeries.   . Hypercholesteremia   . Hypertension   . Sleep apnea    does not use CPAP  . Stroke [facility]) 2011   Past Surgical History:  Procedure Laterality Date  . APPENDECTOMY    . CHOLECYSTECTOMY    . COLONOSCOPY    .  COLONOSCOPY N/A 01/22/2019   Procedure: COLONOSCOPY;  Surgeon: Rogene Houston, MD;  [facility];  Service: Endoscopy;  Laterality: N/A;  9:30  . EYE SURGERY Left    Vitrectomy, bilateral cataracts  . FRACTURE SURGERY Left    lower leg/ankle  . HEMORRHOID SURGERY  2007?  Marland Kitchen PARS PLANA VITRECTOMY Right 12/10/2013   Procedure: PARS PLANA VITRECTOMY 25 GAUGE WITH ENDOLASER RIGHT EYE ;  Surgeon: [name], MD;  Location: [place];  Service: Ophthalmology;  Laterality: Right;  . POLYPECTOMY  01/22/2019   Procedure: POLYPECTOMY;  Surgeon: Rogene Houston, MD;  [facility];  Service: Endoscopy;;  colon    FAMILY HISTORY History reviewed. No pertinent family history.  SOCIAL HISTORY Social History   Tobacco Use  . Smoking status: Former Smoker    Types:  Cigarettes    Quit date: 12/11/1987    Years since quitting: 33.3  . Smokeless tobacco: Never Used  Vaping Use  . Vaping Use: Never used  Substance Use Topics  . Alcohol use: Yes    Comment: occasional  . Drug use: No         OPHTHALMIC EXAM: Base Eye Exam    Visual Acuity (Snellen - Linear)      Right Left   Dist [place] 20/30 20/40 -2       Tonometry (Tonopen, 9:16 AM)      Right Left   Pressure 16 16       Pupils      Pupils Dark Light Shape React APD   Right PERRL 5 4 Round Brisk None   Left PERRL 5 4 Round Brisk None       Visual Fields (Counting fingers)      Left Right    Full Full       Neuro/Psych    Oriented x3: Yes   Mood/Affect: Normal       Dilation    Right eye: 1.0% Mydriacyl, 2.5% Phenylephrine @ 9:16 AM        Slit Lamp and Fundus Exam    External Exam      Right Left   External Normal Normal       Slit Lamp Exam      Right Left   Lids/Lashes Normal Normal   Conjunctiva/Sclera White and quiet White and quiet   Cornea Clear Clear   Anterior Chamber Deep and quiet Deep and quiet   Iris Round and reactive Round and reactive   Lens Posterior chamber intraocular lens Posterior chamber intraocular lens   Anterior Vitreous Normal Normal       Fundus Exam      Right Left   Posterior Vitreous Normal    Disc Normal    C/D Ratio 0.65    Macula Microaneurysms, Macular thickening, temporal aspect of the fovea in the region of previous capillary dropout noted in angiography performed February 2021, cluster of aneurysms temporally no exudates, no macular thickening   Vessels  PDR-quiet    Periphery Normal           IMAGING AND PROCEDURES  Imaging and Procedures for 04/18/21  OCT, Retina - OU - Both Eyes       Right Eye Quality was good. Scan locations included subfoveal. Central Foveal Thickness: 382. Progression has improved. Findings include abnormal foveal contour.   Left Eye Quality was good. Scan locations included subfoveal.  Central Foveal Thickness: 286. Progression has been stable. Findings include normal foveal contour.  Notes Clinically significant macular edema the right eye stable at some 9 weeks post Avastin.  Will need repeat injection today and examination again in a  to 9-week       Intravitreal Injection, Pharmacologic Agent - OD - Right Eye       Time Out 04/18/2021. 10:09 AM. Confirmed correct patient, procedure, site, and patient consented.   Anesthesia Topical anesthesia was used. Anesthetic medications included Akten 3.5%.   Procedure Preparation included Tobramycin 0.3%, 10% betadine to eyelids, 5% betadine to ocular surface. A 30 gauge needle was used.   Injection:  2.5 mg Bevacizumab (AVASTIN) 2.5mg /0.99mL SOSY   NDC: M6102387, [id number]   Route: Intravitreal, Site: Right Eye  Post-op Post injection exam found visual acuity of at least counting fingers. The patient tolerated the procedure well. There were no complications. The patient received written and verbal post procedure care education. Post injection medications were not given.                 ASSESSMENT/PLAN:  Stable treated proliferative diabetic retinopathy of left eye determined by examination associated with type 2 diabetes mellitus (HCC) No active disease OS, PDR quiet  Retinal telangiectasis of right eye Patient remains compliant on CPAP with excellent stabilization of macular perfusion  Diabetic macular edema of right eye with proliferative retinopathy associated with type 2 diabetes mellitus (HCC) Focal thickening temporal aspect of the fovea has remained stable yet under control and yet still active at 9-week interval follow-up today      ICD-10-CM   1. Diabetic macular edema of right eye with proliferative retinopathy associated with type 2 diabetes mellitus (HCC)  E11.3511 OCT, Retina - OU - Both Eyes    Intravitreal Injection, Pharmacologic Agent - OD - Right Eye    bevacizumab (AVASTIN) SOSY  2.5 mg  2. Stable treated proliferative diabetic retinopathy of left eye determined by examination associated with type 2 diabetes mellitus ([place])  [id number]   3. Retinal telangiectasis of right eye  H35.071     1.  CSME OD, chronic, slightly recurrent yet still stabilized, threatening center involvement at 9-week interval today post Avastin injection.  We will repeat injection today and examination next in 9 weeks  2.  Dilate OU next  3.  Ophthalmic Meds Ordered this visit:  Meds ordered this encounter  Medications  . bevacizumab (AVASTIN) SOSY 2.5 mg       Return in about 9 weeks (around 06/20/2021) for DILATE OU, AVASTIN OCT, OD.  There are no Patient Instructions on file for this visit.   Explained the diagnoses, plan, and follow up with the patient and they expressed understanding.  Patient expressed understanding of the importance of proper follow up care.   Clent Demark Quorra Rosene M.D. Diseases & Surgery of the Retina and Vitreous Retina & Diabetic [place] 04/18/21     Abbreviations: M myopia (nearsighted); A astigmatism; H hyperopia (farsighted); P presbyopia; Mrx spectacle prescription;  CTL contact lenses; OD right eye; OS left eye; OU both eyes  XT exotropia; ET esotropia; PEK punctate epithelial keratitis; PEE punctate epithelial erosions; DES dry eye syndrome; MGD meibomian gland dysfunction; ATs artificial tears; PFAT's preservative free artificial tears; [place] nuclear sclerotic cataract; PSC posterior subcapsular cataract; ERM epi-retinal membrane; PVD posterior vitreous detachment; RD retinal detachment; DM diabetes mellitus; DR diabetic retinopathy; NPDR non-proliferative diabetic retinopathy; PDR proliferative diabetic retinopathy; CSME clinically significant macular edema; DME diabetic macular edema; dbh dot blot hemorrhages; CWS cotton wool spot; POAG primary open angle glaucoma;  C/D cup-to-disc ratio; HVF humphrey visual field; GVF goldmann visual field; OCT optical  coherence tomography; IOP intraocular pressure; BRVO Branch retinal vein occlusion; CRVO central retinal vein occlusion; CRAO central retinal artery occlusion; BRAO branch retinal artery occlusion; RT retinal tear; SB scleral buckle; PPV pars plana vitrectomy; VH Vitreous hemorrhage; PRP panretinal laser photocoagulation; IVK intravitreal kenalog; VMT vitreomacular traction; MH Macular hole;  NVD neovascularization of the disc; NVE neovascularization elsewhere; AREDS age related eye disease study; ARMD age related macular degeneration; POAG primary open angle glaucoma; EBMD epithelial/anterior basement membrane dystrophy; ACIOL anterior chamber intraocular lens; IOL intraocular lens; PCIOL posterior chamber intraocular lens; Phaco/IOL phacoemulsification with intraocular lens placement; [place] photorefractive keratectomy; LASIK laser assisted in situ keratomileusis; HTN hypertension; DM diabetes mellitus; COPD chronic obstructive pulmonary disease

## 2021-04-18 NOTE — Assessment & Plan Note (Signed)
Patient remains compliant on CPAP with excellent stabilization of macular perfusion

## 2021-04-18 NOTE — Assessment & Plan Note (Signed)
No active disease OS, PDR quiet

## 2021-05-04 DIAGNOSIS — E1165 Type 2 diabetes mellitus with hyperglycemia: Secondary | ICD-10-CM | POA: Diagnosis not present

## 2021-05-04 DIAGNOSIS — E113511 Type 2 diabetes mellitus with proliferative diabetic retinopathy with macular edema, right eye: Secondary | ICD-10-CM | POA: Diagnosis not present

## 2021-05-04 DIAGNOSIS — G4700 Insomnia, unspecified: Secondary | ICD-10-CM | POA: Diagnosis not present

## 2021-05-04 DIAGNOSIS — Z299 Encounter for prophylactic measures, unspecified: Secondary | ICD-10-CM | POA: Diagnosis not present

## 2021-05-04 DIAGNOSIS — I10 Essential (primary) hypertension: Secondary | ICD-10-CM | POA: Diagnosis not present

## 2021-06-20 ENCOUNTER — Encounter (INDEPENDENT_AMBULATORY_CARE_PROVIDER_SITE_OTHER): Payer: Self-pay | Admitting: Ophthalmology

## 2021-06-20 ENCOUNTER — Other Ambulatory Visit: Payer: Self-pay

## 2021-06-20 ENCOUNTER — Ambulatory Visit (INDEPENDENT_AMBULATORY_CARE_PROVIDER_SITE_OTHER): Payer: Medicare Other | Admitting: Ophthalmology

## 2021-06-20 DIAGNOSIS — H35072 Retinal telangiectasis, left eye: Secondary | ICD-10-CM | POA: Diagnosis not present

## 2021-06-20 DIAGNOSIS — H35071 Retinal telangiectasis, right eye: Secondary | ICD-10-CM | POA: Diagnosis not present

## 2021-06-20 DIAGNOSIS — E113511 Type 2 diabetes mellitus with proliferative diabetic retinopathy with macular edema, right eye: Secondary | ICD-10-CM

## 2021-06-20 MED ORDER — BEVACIZUMAB 2.5 MG/0.1ML IZ SOSY
2.5000 mg | PREFILLED_SYRINGE | INTRAVITREAL | Status: AC | PRN
  Administered 2021-06-20: 2.5 mg via INTRAVITREAL

## 2021-06-20 NOTE — Assessment & Plan Note (Signed)
See comments of CPAP use continuing noted with the left eye

## 2021-06-20 NOTE — Progress Notes (Signed)
06/20/2021     CHIEF COMPLAINT Patient presents for Retina Follow Up (9 week fu OU and Avastin OD/Pt states VA OU stable since last visit. Pt denies FOL, floaters, or ocular pain OU. Marc Haynes: 6.6/LBS: 103)   HISTORY OF PRESENT ILLNESS: Marc Haynes is a 70 y.o. male who presents to the clinic today for:   HPI     Retina Follow Up           Diagnosis: Diabetic Retinopathy   Laterality: right eye   Onset: 9 weeks ago   Severity: mild   Duration: 9 weeks   Course: stable   Comments: 9 week fu OU and Avastin OD Pt states VA OU stable since last visit. Pt denies FOL, floaters, or ocular pain OU.  A1C: 6.6 LBS: 103       Last edited by Marc Haynes, COA on 06/20/2021  9:24 AM.      Referring physician: [name], MD [name],  [place] [address]  HISTORICAL INFORMATION:   Selected notes from the MEDICAL RECORD NUMBER       CURRENT MEDICATIONS: No current outpatient medications on file. (Ophthalmic Drugs)   No current facility-administered medications for this visit. (Ophthalmic Drugs)   Current Outpatient Medications (Other)  Medication Sig   acetaminophen (TYLENOL) 500 MG tablet Take 1,000 mg by mouth every 6 (six) hours as needed (for pain.).   citalopram (CELEXA) 40 MG tablet Take 40 mg by mouth every evening.   clopidogrel (PLAVIX) 75 MG tablet Take 75 mg by mouth every evening.    diltiazem (TIAZAC) 240 MG 24 hr capsule Take 240 mg by mouth every evening.    ezetimibe (ZETIA) 10 MG tablet Take 10 mg by mouth every evening.    hydrochlorothiazide (MICROZIDE) 12.5 MG capsule Take 12.5 mg by mouth every evening.    Icosapent Ethyl (VASCEPA) 1 g CAPS Take 2 g by mouth 2 (two) times daily.    Insulin Degludec (TRESIBA FLEXTOUCH) 200 UNIT/ML SOPN Inject 120 Units into the skin daily.    losartan (COZAAR) 100 MG tablet Take 100 mg by mouth daily.   metFORMIN (GLUCOPHAGE-XR) 500 MG 24 hr tablet Take 2,000 mg by mouth every evening.   Multiple Vitamin  (MULTIVITAMIN WITH MINERALS) TABS tablet Take 1 tablet by mouth every evening.    NOVOLOG FLEXPEN 100 UNIT/ML FlexPen Inject 8-30 Units into the skin 5 (five) times daily as needed. Sliding Scale Insulin   pantoprazole (PROTONIX) 40 MG tablet Take 40 mg by mouth every evening.   rosuvastatin (CRESTOR) 20 MG tablet Take 20 mg by mouth every evening.    VICTOZA 18 MG/3ML SOPN Inject 1.8 mg into the skin every evening.    No current facility-administered medications for this visit. (Other)      REVIEW OF SYSTEMS:    ALLERGIES Allergies  Allergen Reactions   Cortisone Other (See Comments)    Pancreatitis    Invokana [Canagliflozin] Other (See Comments)    Patient does not tolerate (hospitalized)     PAST MEDICAL HISTORY Past Medical History:  Diagnosis Date   Diabetes mellitus without complication ([place])    Eye abnormality    edema of eye .  has had laser surgeries.    Hypercholesteremia    Hypertension    Sleep apnea    does not use CPAP   Stroke [facility] 2011   Past Surgical History:  Procedure Laterality Date   APPENDECTOMY     CHOLECYSTECTOMY  COLONOSCOPY     COLONOSCOPY N/A 01/22/2019   Procedure: COLONOSCOPY;  Surgeon: Marc Houston, MD;  [facility];  Service: Endoscopy;  Laterality: N/A;  9:30   EYE SURGERY Left    Vitrectomy, bilateral cataracts   FRACTURE SURGERY Left    lower leg/ankle   HEMORRHOID SURGERY  2007?   PARS PLANA VITRECTOMY Right 12/10/2013   Procedure: PARS PLANA VITRECTOMY 25 GAUGE WITH ENDOLASER RIGHT EYE ;  Surgeon: Marc Horn, MD;  Location: [facility];  Service: Ophthalmology;  Laterality: Right;   POLYPECTOMY  01/22/2019   Procedure: POLYPECTOMY;  Surgeon: Marc Houston, MD;  [facility];  Service: Endoscopy;;  colon    FAMILY HISTORY History reviewed. No pertinent family history.  SOCIAL HISTORY Social History   Tobacco Use   Smoking status: Former    Types: Cigarettes    Quit date: 12/11/1987    Years  since quitting: 33.6   Smokeless tobacco: Never  Vaping Use   Vaping Use: Never used  Substance Use Topics   Alcohol use: Yes    Comment: occasional   Drug use: No         OPHTHALMIC EXAM:  Base Eye Exam     Visual Acuity (ETDRS)       Right Left   Dist [place] 20/50 20/60   Dist ph [place] 20/40 -2 20/40         Tonometry (Tonopen, 9:32 AM)       Right Left   Pressure 14 12         Pupils       Pupils Dark Light Shape React APD   Right PERRL 5 4 Round Brisk None   Left PERRL 5 4 Round Brisk None         Visual Fields (Counting fingers)       Left Right    Full          Extraocular Movement       Right Left    Full Full         Neuro/Psych     Oriented x3: Yes   Mood/Affect: Normal         Dilation     Both eyes: 1.0% Mydriacyl, 2.5% Phenylephrine @ 9:32 AM           Slit Lamp and Fundus Exam     External Exam       Right Left   External Normal Normal         Slit Lamp Exam       Right Left   Lids/Lashes Normal Normal   Conjunctiva/Sclera White and quiet White and quiet   Cornea Clear Clear   Anterior Chamber Deep and quiet Deep and quiet   Iris Round and reactive Round and reactive   Lens Posterior chamber intraocular lens Posterior chamber intraocular lens   Anterior Vitreous Normal Normal         Fundus Exam       Right Left   Posterior Vitreous Normal Normal   Disc Normal Normal   C/D Ratio 0.65 0.65   Macula Microaneurysms, Macular thickening, temporal aspect of the fovea in the region of previous capillary dropout noted in angiography performed February 2021, cluster of aneurysms temporally no exudates, no macular thickening   Vessels  PDR-quiet  PDR-quiet   Periphery Normal Normal            IMAGING AND PROCEDURES  Imaging and Procedures for  07/14/21  OCT, Retina - OU - Both Eyes       Right Eye Quality was good. Scan locations included subfoveal. Progression has improved. Findings include abnormal  foveal contour, cystoid macular edema.   Left Eye Quality was good. Scan locations included subfoveal. Progression has been stable. Findings include normal foveal contour.   Notes Clinically significant macular edema the right eye stable at some 9 weeks post Avastin.  Will need repeat injection today and examination again in a  to 9-week     Intravitreal Injection, Pharmacologic Agent - OD - Right Eye       Time Out 06/20/2021. 10:09 AM. Confirmed correct patient, procedure, site, and patient consented.   Anesthesia Topical anesthesia was used. Anesthetic medications included Akten 3.5%.   Procedure Preparation included Tobramycin 0.3%, 10% betadine to eyelids, 5% betadine to ocular surface. A 30 gauge needle was used.   Injection: 2.5 mg bevacizumab 2.5 MG/0.1ML   Route: Intravitreal, Site: Right Eye   NDC: [id number]   Post-op Post injection exam found visual acuity of at least counting fingers. The patient tolerated the procedure well. There were no complications. The patient received written and verbal post procedure care education. Post injection medications were not given.              ASSESSMENT/PLAN:  Diabetic macular edema of right eye with proliferative retinopathy associated with type 2 diabetes mellitus (HCC) Diabetic CSME OD, stabilized and improved on antivegF.  Now at 9-week interval extension.    Retinal telangiectasia of left eye Patient continues on CPAP which prevents nightly hypoxic damage to the macula  Retinal telangiectasis of right eye See comments of CPAP use continuing noted with the left eye     ICD-10-CM   1. Diabetic macular edema of right eye with proliferative retinopathy associated with type 2 diabetes mellitus (HCC)  E11.3511 OCT, Retina - OU - Both Eyes    Intravitreal Injection, Pharmacologic Agent - OD - Right Eye    bevacizumab (AVASTIN) SOSY 2.5 mg    2. Retinal telangiectasia of left eye  H35.072     3. Retinal  telangiectasis of right eye  H35.071       1.  Repeat intravitreal Avastin OD today for diabetic CSME which is improved  2.  3.  Ophthalmic Meds Ordered this visit:  Meds ordered this encounter  Medications   bevacizumab (AVASTIN) SOSY 2.5 mg       Return in about 10 weeks (around 08/29/2021) for DILATE OU, AVASTIN OCT, OD.  There are no Patient Instructions on file for this visit.   Explained the diagnoses, plan, and follow up with the patient and they expressed understanding.  Patient expressed understanding of the importance of proper follow up care.   Clent Demark Nareg Breighner M.D. Diseases & Surgery of the Retina and Vitreous Retina & Diabetic [place] 07/14/21     Abbreviations: M myopia (nearsighted); A astigmatism; H hyperopia (farsighted); P presbyopia; Mrx spectacle prescription;  CTL contact lenses; OD right eye; OS left eye; OU both eyes  XT exotropia; ET esotropia; PEK punctate epithelial keratitis; PEE punctate epithelial erosions; DES dry eye syndrome; MGD meibomian gland dysfunction; ATs artificial tears; PFAT's preservative free artificial tears; [place] nuclear sclerotic cataract; PSC posterior subcapsular cataract; ERM epi-retinal membrane; PVD posterior vitreous detachment; RD retinal detachment; DM diabetes mellitus; DR diabetic retinopathy; NPDR non-proliferative diabetic retinopathy; PDR proliferative diabetic retinopathy; CSME clinically significant macular edema; DME diabetic macular edema; dbh dot blot hemorrhages; CWS cotton wool  spot; POAG primary open angle glaucoma; C/D cup-to-disc ratio; HVF humphrey visual field; GVF goldmann visual field; OCT optical coherence tomography; IOP intraocular pressure; BRVO Branch retinal vein occlusion; CRVO central retinal vein occlusion; CRAO central retinal artery occlusion; BRAO branch retinal artery occlusion; RT retinal tear; SB scleral buckle; PPV pars plana vitrectomy; VH Vitreous hemorrhage; PRP panretinal laser  photocoagulation; IVK intravitreal kenalog; VMT vitreomacular traction; MH Macular hole;  NVD neovascularization of the disc; NVE neovascularization elsewhere; AREDS age related eye disease study; ARMD age related macular degeneration; POAG primary open angle glaucoma; EBMD epithelial/anterior basement membrane dystrophy; ACIOL anterior chamber intraocular lens; IOL intraocular lens; PCIOL posterior chamber intraocular lens; Phaco/IOL phacoemulsification with intraocular lens placement; [place] photorefractive keratectomy; LASIK laser assisted in situ keratomileusis; HTN hypertension; DM diabetes mellitus; COPD chronic obstructive pulmonary disease

## 2021-06-20 NOTE — Assessment & Plan Note (Signed)
Patient continues on CPAP which prevents nightly hypoxic damage to the macula

## 2021-06-20 NOTE — Assessment & Plan Note (Signed)
Diabetic CSME OD, stabilized and improved on antivegF.  Now at 9-week interval extension.

## 2021-07-04 DIAGNOSIS — E11311 Type 2 diabetes mellitus with unspecified diabetic retinopathy with macular edema: Secondary | ICD-10-CM | POA: Diagnosis not present

## 2021-07-04 DIAGNOSIS — E782 Mixed hyperlipidemia: Secondary | ICD-10-CM | POA: Diagnosis not present

## 2021-07-04 DIAGNOSIS — Z794 Long term (current) use of insulin: Secondary | ICD-10-CM | POA: Diagnosis not present

## 2021-07-04 DIAGNOSIS — E1165 Type 2 diabetes mellitus with hyperglycemia: Secondary | ICD-10-CM | POA: Diagnosis not present

## 2021-08-08 DIAGNOSIS — E113511 Type 2 diabetes mellitus with proliferative diabetic retinopathy with macular edema, right eye: Secondary | ICD-10-CM | POA: Diagnosis not present

## 2021-08-08 DIAGNOSIS — E1165 Type 2 diabetes mellitus with hyperglycemia: Secondary | ICD-10-CM | POA: Diagnosis not present

## 2021-08-08 DIAGNOSIS — I10 Essential (primary) hypertension: Secondary | ICD-10-CM | POA: Diagnosis not present

## 2021-08-08 DIAGNOSIS — Z299 Encounter for prophylactic measures, unspecified: Secondary | ICD-10-CM | POA: Diagnosis not present

## 2021-08-08 DIAGNOSIS — G4700 Insomnia, unspecified: Secondary | ICD-10-CM | POA: Diagnosis not present

## 2021-08-24 ENCOUNTER — Encounter (INDEPENDENT_AMBULATORY_CARE_PROVIDER_SITE_OTHER): Payer: Medicare Other | Admitting: Ophthalmology

## 2021-08-26 ENCOUNTER — Encounter (INDEPENDENT_AMBULATORY_CARE_PROVIDER_SITE_OTHER): Payer: Self-pay

## 2021-08-26 DIAGNOSIS — H02834 Dermatochalasis of left upper eyelid: Secondary | ICD-10-CM | POA: Diagnosis not present

## 2021-08-26 DIAGNOSIS — H02831 Dermatochalasis of right upper eyelid: Secondary | ICD-10-CM | POA: Diagnosis not present

## 2021-08-26 DIAGNOSIS — H02135 Senile ectropion of left lower eyelid: Secondary | ICD-10-CM | POA: Diagnosis not present

## 2021-08-26 DIAGNOSIS — H02132 Senile ectropion of right lower eyelid: Secondary | ICD-10-CM | POA: Diagnosis not present

## 2021-08-27 HISTORY — PX: BLEPHAROPLASTY: SUR158

## 2021-08-29 ENCOUNTER — Encounter (INDEPENDENT_AMBULATORY_CARE_PROVIDER_SITE_OTHER): Payer: Medicare Other | Admitting: Ophthalmology

## 2021-09-14 DIAGNOSIS — H02135 Senile ectropion of left lower eyelid: Secondary | ICD-10-CM | POA: Diagnosis not present

## 2021-09-14 DIAGNOSIS — H02403 Unspecified ptosis of bilateral eyelids: Secondary | ICD-10-CM | POA: Diagnosis not present

## 2021-09-14 DIAGNOSIS — H02831 Dermatochalasis of right upper eyelid: Secondary | ICD-10-CM | POA: Diagnosis not present

## 2021-09-14 DIAGNOSIS — H02402 Unspecified ptosis of left eyelid: Secondary | ICD-10-CM | POA: Diagnosis not present

## 2021-09-14 DIAGNOSIS — Z01818 Encounter for other preprocedural examination: Secondary | ICD-10-CM | POA: Diagnosis not present

## 2021-09-14 DIAGNOSIS — H02401 Unspecified ptosis of right eyelid: Secondary | ICD-10-CM | POA: Diagnosis not present

## 2021-09-14 DIAGNOSIS — H02834 Dermatochalasis of left upper eyelid: Secondary | ICD-10-CM | POA: Diagnosis not present

## 2021-09-14 DIAGNOSIS — H02132 Senile ectropion of right lower eyelid: Secondary | ICD-10-CM | POA: Diagnosis not present

## 2021-09-22 ENCOUNTER — Encounter (INDEPENDENT_AMBULATORY_CARE_PROVIDER_SITE_OTHER): Payer: Medicare Other | Admitting: Ophthalmology

## 2021-10-05 ENCOUNTER — Encounter (INDEPENDENT_AMBULATORY_CARE_PROVIDER_SITE_OTHER): Payer: Medicare Other | Admitting: Ophthalmology

## 2021-10-05 ENCOUNTER — Other Ambulatory Visit: Payer: Self-pay

## 2021-10-05 ENCOUNTER — Ambulatory Visit (INDEPENDENT_AMBULATORY_CARE_PROVIDER_SITE_OTHER): Payer: Medicare Other | Admitting: Ophthalmology

## 2021-10-05 ENCOUNTER — Encounter (INDEPENDENT_AMBULATORY_CARE_PROVIDER_SITE_OTHER): Payer: Self-pay | Admitting: Ophthalmology

## 2021-10-05 DIAGNOSIS — E113511 Type 2 diabetes mellitus with proliferative diabetic retinopathy with macular edema, right eye: Secondary | ICD-10-CM

## 2021-10-05 DIAGNOSIS — E113552 Type 2 diabetes mellitus with stable proliferative diabetic retinopathy, left eye: Secondary | ICD-10-CM | POA: Diagnosis not present

## 2021-10-05 MED ORDER — BEVACIZUMAB 2.5 MG/0.1ML IZ SOSY
2.5000 mg | PREFILLED_SYRINGE | INTRAVITREAL | Status: AC | PRN
Start: 2021-10-05 — End: 2021-10-05
  Administered 2021-10-05: 2.5 mg via INTRAVITREAL

## 2021-10-05 NOTE — Progress Notes (Signed)
10/05/2021     CHIEF COMPLAINT Patient presents for  Chief Complaint  Patient presents with   Retina Follow Up      HISTORY OF PRESENT ILLNESS: Marc Haynes is a 70 y.o. male who presents to the clinic today for:   HPI     Retina Follow Up   Patient presents with  Diabetic Retinopathy.  This started 3 months ago.  Duration of 3 months.        Comments   3 month f/u OU with OCT and possible Avastin injection OD        Last edited by [name], COA on 10/05/2021 10:11 AM.      Referring physician: [name], MD [place],  [place] [id number]  HISTORICAL INFORMATION:   Selected notes from the [place]: No current outpatient medications on file. (Ophthalmic Drugs)   No current facility-administered medications for this visit. (Ophthalmic Drugs)   Current Outpatient Medications (Other)  Medication Sig   acetaminophen (TYLENOL) 500 MG tablet Take 1,000 mg by mouth every 6 (six) hours as needed (for pain.).   citalopram (CELEXA) 40 MG tablet Take 40 mg by mouth every evening.   clopidogrel (PLAVIX) 75 MG tablet Take 75 mg by mouth every evening.    diltiazem (TIAZAC) 240 MG 24 hr capsule Take 240 mg by mouth every evening.    ezetimibe (ZETIA) 10 MG tablet Take 10 mg by mouth every evening.    hydrochlorothiazide (MICROZIDE) 12.5 MG capsule Take 12.5 mg by mouth every evening.    Icosapent Ethyl (VASCEPA) 1 g CAPS Take 2 g by mouth 2 (two) times daily.    Insulin Degludec (TRESIBA FLEXTOUCH) 200 UNIT/ML SOPN Inject 120 Units into the skin daily.    losartan (COZAAR) 100 MG tablet Take 100 mg by mouth daily.   metFORMIN (GLUCOPHAGE-XR) 500 MG 24 hr tablet Take 2,000 mg by mouth every evening.   Multiple Vitamin (MULTIVITAMIN WITH MINERALS) TABS tablet Take 1 tablet by mouth every evening.    NOVOLOG FLEXPEN 100 UNIT/ML FlexPen Inject 8-30 Units into the skin 5 (five) times daily as needed. Sliding Scale  Insulin   pantoprazole (PROTONIX) 40 MG tablet Take 40 mg by mouth every evening.   rosuvastatin (CRESTOR) 20 MG tablet Take 20 mg by mouth every evening.    VICTOZA 18 MG/3ML SOPN Inject 1.8 mg into the skin every evening.    No current facility-administered medications for this visit. (Other)      REVIEW OF SYSTEMS:    ALLERGIES Allergies  Allergen Reactions   Cortisone Other (See Comments)    Pancreatitis    Invokana [Canagliflozin] Other (See Comments)    Patient does not tolerate (hospitalized)     PAST MEDICAL HISTORY Past Medical History:  Diagnosis Date   Diabetes mellitus without complication ([place])    Eye abnormality    edema of eye .  has had laser surgeries.    Hypercholesteremia    Hypertension    Sleep apnea    does not use CPAP   Stroke [facility]) 2011   Past Surgical History:  Procedure Laterality Date   APPENDECTOMY     BLEPHAROPLASTY Bilateral 08/2021   CHOLECYSTECTOMY     COLONOSCOPY     COLONOSCOPY N/A 01/22/2019   Procedure: COLONOSCOPY;  Surgeon: Rogene Houston, MD;  [facility];  Service: Endoscopy;  Laterality: N/A;  9:30   EYE  SURGERY Left    Vitrectomy, bilateral cataracts   FRACTURE SURGERY Left    lower leg/ankle   HEMORRHOID SURGERY  2007?   PARS PLANA VITRECTOMY Right 12/10/2013   Procedure: PARS PLANA VITRECTOMY 25 GAUGE WITH ENDOLASER RIGHT EYE ;  Surgeon: [name], MD;  Location: [place];  Service: Ophthalmology;  Laterality: Right;   POLYPECTOMY  01/22/2019   Procedure: POLYPECTOMY;  Surgeon: Rogene Houston, MD;  [facility];  Service: Endoscopy;;  colon    FAMILY HISTORY History reviewed. No pertinent family history.  SOCIAL HISTORY Social History   Tobacco Use   Smoking status: Former    Types: Cigarettes    Quit date: 12/11/1987    Years since quitting: 33.8   Smokeless tobacco: Never  Vaping Use   Vaping Use: Never used  Substance Use Topics   Alcohol use: Yes    Comment: occasional    Drug use: No         OPHTHALMIC EXAM:  Base Eye Exam     Visual Acuity (ETDRS)       Right Left   Dist [place] 20/25 +2 20/30 +1   Dist ph [place]  20/20 -1         Tonometry (Tonopen, 10:19 AM)       Right Left   Pressure 10 11         Pupils       Pupils Dark Light Shape React APD   Right PERRL 5 4 Round Brisk None   Left PERRL 5 4 Round Brisk None         Visual Fields (Counting fingers)       Left Right    Full          Extraocular Movement       Right Left    Full, Ortho Full, Ortho         Neuro/Psych     Oriented x3: Yes   Mood/Affect: Normal         Dilation     Both eyes: 1.0% Mydriacyl, 2.5% Phenylephrine @ 10:19 AM           Slit Lamp and Fundus Exam     External Exam       Right Left   External Normal Normal         Slit Lamp Exam       Right Left   Lids/Lashes Normal Normal   Conjunctiva/Sclera White and quiet White and quiet   Cornea Clear Clear   Anterior Chamber Deep and quiet Deep and quiet   Iris Round and reactive Round and reactive   Lens Posterior chamber intraocular lens Posterior chamber intraocular lens   Anterior Vitreous Normal Normal         Fundus Exam       Right Left   Posterior Vitreous Normal Normal   Disc Normal Normal   C/D Ratio 0.65 0.65   Macula Microaneurysms, Macular thickening, temporal aspect of the fovea in the region of previous capillary dropout noted in angiography performed February 2021, cluster of aneurysms temporally no exudates, no macular thickening   Vessels  PDR-quiet  PDR-quiet   Periphery Normal Normal            IMAGING AND PROCEDURES  Imaging and Procedures for 10/05/21  OCT, Retina - OU - Both Eyes       Right Eye Quality was good. Scan locations included subfoveal. Central Foveal Thickness: 340. Progression has  improved. Findings include abnormal foveal contour, cystoid macular edema.   Left Eye Quality was good. Scan locations included subfoveal.  Central Foveal Thickness: 279. Progression has been stable. Findings include normal foveal contour.   Notes Clinically significant macular edema the right eye stable at some 3 months post Avastin.  Will need repeat injection today and examination again in a 7-month exam     Intravitreal Injection, Pharmacologic Agent - OD - Right Eye       Time Out 10/05/2021. 11:07 AM. Confirmed correct patient, procedure, site, and patient consented.   Anesthesia Topical anesthesia was used. Anesthetic medications included Lidocaine 4%.   Procedure Preparation included 5% betadine to ocular surface, 10% betadine to eyelids. A 30 gauge needle was used.   Injection: 2.5 mg bevacizumab 2.5 MG/0.1ML   Route: Intravitreal, Site: Right Eye   NDC: [phone], Lot: [id number]   Post-op Post injection exam found visual acuity of at least counting fingers. The patient tolerated the procedure well. There were no complications. The patient received written and verbal post procedure care education. Post injection medications included ocuflox.              ASSESSMENT/PLAN:  Stable treated proliferative diabetic retinopathy of left eye determined by examination associated with type 2 diabetes mellitus (HCC) No active maculopathy OS  Diabetic macular edema of right eye with proliferative retinopathy associated with type 2 diabetes mellitus ([place]) CSME temporally OD Vastly improved even at 19-month We will repeat injection Avastin today and maintain 73-month evaluation v     ICD-10-CM   1. Diabetic macular edema of right eye with proliferative retinopathy associated with type 2 diabetes mellitus (HCC)  E11.3511 OCT, Retina - OU - Both Eyes    Intravitreal Injection, Pharmacologic Agent - OD - Right Eye    bevacizumab (AVASTIN) SOSY 2.5 mg    CANCELED: Intravitreal Injection, Pharmacologic Agent - OD - Right Eye    2. Stable treated proliferative diabetic retinopathy of left eye determined by examination  associated with type 2 diabetes mellitus ([place])  D66.4403       1.  OD with CSME temporal to FAZ, yet much less extensive than as compared to 3 months previous and its treatment time.  Repeat intravitreal Avastin OD today and examination next OU in 31-month  2.  OS and OD with quiescent PDR components  3.  Ophthalmic Meds Ordered this visit:  Meds ordered this encounter  Medications   bevacizumab (AVASTIN) SOSY 2.5 mg       Return in about 3 months (around 01/05/2022) for DILATE OU, COLOR FP, AVASTIN OCT, OD.  There are no Patient Instructions on file for this visit.   Explained the diagnoses, plan, and follow up with the patient and they expressed understanding.  Patient expressed understanding of the importance of proper follow up care.   Clent Demark Bon Dowis M.D. Diseases & Surgery of the Retina and Vitreous Retina & Diabetic [place] 10/05/21     Abbreviations: M myopia (nearsighted); A astigmatism; H hyperopia (farsighted); P presbyopia; Mrx spectacle prescription;  CTL contact lenses; OD right eye; OS left eye; OU both eyes  XT exotropia; ET esotropia; PEK punctate epithelial keratitis; PEE punctate epithelial erosions; DES dry eye syndrome; MGD meibomian gland dysfunction; ATs artificial tears; PFAT's preservative free artificial tears; [place] nuclear sclerotic cataract; PSC posterior subcapsular cataract; ERM epi-retinal membrane; PVD posterior vitreous detachment; RD retinal detachment; DM diabetes mellitus; DR diabetic retinopathy; NPDR non-proliferative diabetic retinopathy; PDR proliferative diabetic retinopathy; CSME clinically significant  macular edema; DME diabetic macular edema; dbh dot blot hemorrhages; CWS cotton wool spot; POAG primary open angle glaucoma; C/D cup-to-disc ratio; HVF humphrey visual field; GVF goldmann visual field; OCT optical coherence tomography; IOP intraocular pressure; BRVO Branch retinal vein occlusion; CRVO central retinal vein occlusion; CRAO  central retinal artery occlusion; BRAO branch retinal artery occlusion; RT retinal tear; SB scleral buckle; PPV pars plana vitrectomy; VH Vitreous hemorrhage; PRP panretinal laser photocoagulation; IVK intravitreal kenalog; VMT vitreomacular traction; MH Macular hole;  NVD neovascularization of the disc; NVE neovascularization elsewhere; AREDS age related eye disease study; ARMD age related macular degeneration; POAG primary open angle glaucoma; EBMD epithelial/anterior basement membrane dystrophy; ACIOL anterior chamber intraocular lens; IOL intraocular lens; PCIOL posterior chamber intraocular lens; Phaco/IOL phacoemulsification with intraocular lens placement; [place] photorefractive keratectomy; LASIK laser assisted in situ keratomileusis; HTN hypertension; DM diabetes mellitus; COPD chronic obstructive pulmonary disease

## 2021-10-05 NOTE — Assessment & Plan Note (Signed)
No active maculopathy OS

## 2021-10-05 NOTE — Assessment & Plan Note (Signed)
CSME temporally OD Vastly improved even at 36-month We will repeat injection Avastin today and maintain 20-month evaluation v

## 2021-10-07 DIAGNOSIS — E7800 Pure hypercholesterolemia, unspecified: Secondary | ICD-10-CM | POA: Diagnosis not present

## 2021-10-07 DIAGNOSIS — R5383 Other fatigue: Secondary | ICD-10-CM | POA: Diagnosis not present

## 2021-10-07 DIAGNOSIS — Z79899 Other long term (current) drug therapy: Secondary | ICD-10-CM | POA: Diagnosis not present

## 2021-10-07 DIAGNOSIS — Z0000 Encounter for general adult medical examination without abnormal findings: Secondary | ICD-10-CM | POA: Diagnosis not present

## 2021-10-07 DIAGNOSIS — Z7189 Other specified counseling: Secondary | ICD-10-CM | POA: Diagnosis not present

## 2021-10-07 DIAGNOSIS — Z299 Encounter for prophylactic measures, unspecified: Secondary | ICD-10-CM | POA: Diagnosis not present

## 2021-10-07 DIAGNOSIS — E1165 Type 2 diabetes mellitus with hyperglycemia: Secondary | ICD-10-CM | POA: Diagnosis not present

## 2021-10-07 DIAGNOSIS — Z1339 Encounter for screening examination for other mental health and behavioral disorders: Secondary | ICD-10-CM | POA: Diagnosis not present

## 2021-10-07 DIAGNOSIS — Z6830 Body mass index (BMI) 30.0-30.9, adult: Secondary | ICD-10-CM | POA: Diagnosis not present

## 2021-10-07 DIAGNOSIS — Z87891 Personal history of nicotine dependence: Secondary | ICD-10-CM | POA: Diagnosis not present

## 2021-10-07 DIAGNOSIS — Z1331 Encounter for screening for depression: Secondary | ICD-10-CM | POA: Diagnosis not present

## 2021-10-07 DIAGNOSIS — I10 Essential (primary) hypertension: Secondary | ICD-10-CM | POA: Diagnosis not present

## 2021-10-08 DIAGNOSIS — Z23 Encounter for immunization: Secondary | ICD-10-CM | POA: Diagnosis not present

## 2021-11-04 DIAGNOSIS — E782 Mixed hyperlipidemia: Secondary | ICD-10-CM | POA: Diagnosis not present

## 2021-11-04 DIAGNOSIS — E1165 Type 2 diabetes mellitus with hyperglycemia: Secondary | ICD-10-CM | POA: Diagnosis not present

## 2021-11-04 DIAGNOSIS — Z794 Long term (current) use of insulin: Secondary | ICD-10-CM | POA: Diagnosis not present

## 2021-11-07 DIAGNOSIS — Z794 Long term (current) use of insulin: Secondary | ICD-10-CM | POA: Diagnosis not present

## 2021-11-07 DIAGNOSIS — E113513 Type 2 diabetes mellitus with proliferative diabetic retinopathy with macular edema, bilateral: Secondary | ICD-10-CM | POA: Diagnosis not present

## 2021-11-07 DIAGNOSIS — E782 Mixed hyperlipidemia: Secondary | ICD-10-CM | POA: Diagnosis not present

## 2021-11-07 DIAGNOSIS — E785 Hyperlipidemia, unspecified: Secondary | ICD-10-CM | POA: Diagnosis not present

## 2021-11-07 DIAGNOSIS — Z7984 Long term (current) use of oral hypoglycemic drugs: Secondary | ICD-10-CM | POA: Diagnosis not present

## 2021-11-07 DIAGNOSIS — E1165 Type 2 diabetes mellitus with hyperglycemia: Secondary | ICD-10-CM | POA: Diagnosis not present

## 2021-11-07 DIAGNOSIS — E1129 Type 2 diabetes mellitus with other diabetic kidney complication: Secondary | ICD-10-CM | POA: Diagnosis not present

## 2021-11-07 DIAGNOSIS — R809 Proteinuria, unspecified: Secondary | ICD-10-CM | POA: Diagnosis not present

## 2021-11-30 DIAGNOSIS — G2581 Restless legs syndrome: Secondary | ICD-10-CM | POA: Diagnosis not present

## 2021-11-30 DIAGNOSIS — G4700 Insomnia, unspecified: Secondary | ICD-10-CM | POA: Diagnosis not present

## 2021-11-30 DIAGNOSIS — E1165 Type 2 diabetes mellitus with hyperglycemia: Secondary | ICD-10-CM | POA: Diagnosis not present

## 2021-11-30 DIAGNOSIS — Z299 Encounter for prophylactic measures, unspecified: Secondary | ICD-10-CM | POA: Diagnosis not present

## 2021-11-30 DIAGNOSIS — E11319 Type 2 diabetes mellitus with unspecified diabetic retinopathy without macular edema: Secondary | ICD-10-CM | POA: Diagnosis not present

## 2021-11-30 DIAGNOSIS — I10 Essential (primary) hypertension: Secondary | ICD-10-CM | POA: Diagnosis not present

## 2022-01-05 ENCOUNTER — Other Ambulatory Visit: Payer: Self-pay

## 2022-01-05 ENCOUNTER — Encounter (INDEPENDENT_AMBULATORY_CARE_PROVIDER_SITE_OTHER): Payer: Self-pay | Admitting: Ophthalmology

## 2022-01-05 ENCOUNTER — Ambulatory Visit (INDEPENDENT_AMBULATORY_CARE_PROVIDER_SITE_OTHER): Payer: Medicare Other | Admitting: Ophthalmology

## 2022-01-05 ENCOUNTER — Encounter (INDEPENDENT_AMBULATORY_CARE_PROVIDER_SITE_OTHER): Payer: Medicare Other | Admitting: Ophthalmology

## 2022-01-05 DIAGNOSIS — E113552 Type 2 diabetes mellitus with stable proliferative diabetic retinopathy, left eye: Secondary | ICD-10-CM | POA: Diagnosis not present

## 2022-01-05 DIAGNOSIS — E113511 Type 2 diabetes mellitus with proliferative diabetic retinopathy with macular edema, right eye: Secondary | ICD-10-CM

## 2022-01-05 MED ORDER — BEVACIZUMAB 2.5 MG/0.1ML IZ SOSY
2.5000 mg | PREFILLED_SYRINGE | INTRAVITREAL | Status: AC | PRN
  Administered 2022-01-05: 2.5 mg via INTRAVITREAL

## 2022-01-05 NOTE — Progress Notes (Signed)
01/05/2022     CHIEF COMPLAINT Patient presents for  Chief Complaint  Patient presents with   Diabetic Retinopathy with Macular Edema      HISTORY OF PRESENT ILLNESS: Marc Haynes is a 71 y.o. male who presents to the clinic today for:   HPI   3 mos fu OU oct fp, Avastin OD. Patient states vision is stable and unchanged since last visit. Denies any new floaters or FOL.  Last edited by Laurin Coder on 01/05/2022 10:38 AM.      Referring physician: [name], MD [place],  [place] [id number]  HISTORICAL INFORMATION:   Selected notes from the MEDICAL RECORD NUMBER       CURRENT MEDICATIONS: No current outpatient medications on file. (Ophthalmic Drugs)   No current facility-administered medications for this visit. (Ophthalmic Drugs)   Current Outpatient Medications (Other)  Medication Sig   acetaminophen (TYLENOL) 500 MG tablet Take 1,000 mg by mouth every 6 (six) hours as needed (for pain.).   citalopram (CELEXA) 40 MG tablet Take 40 mg by mouth every evening.   clopidogrel (PLAVIX) 75 MG tablet Take 75 mg by mouth every evening.    diltiazem (TIAZAC) 240 MG 24 hr capsule Take 240 mg by mouth every evening.    ezetimibe (ZETIA) 10 MG tablet Take 10 mg by mouth every evening.    hydrochlorothiazide (MICROZIDE) 12.5 MG capsule Take 12.5 mg by mouth every evening.    Icosapent Ethyl (VASCEPA) 1 g CAPS Take 2 g by mouth 2 (two) times daily.    Insulin Degludec (TRESIBA FLEXTOUCH) 200 UNIT/ML SOPN Inject 120 Units into the skin daily.    losartan (COZAAR) 100 MG tablet Take 100 mg by mouth daily.   metFORMIN (GLUCOPHAGE-XR) 500 MG 24 hr tablet Take 2,000 mg by mouth every evening.   Multiple Vitamin (MULTIVITAMIN WITH MINERALS) TABS tablet Take 1 tablet by mouth every evening.    NOVOLOG FLEXPEN 100 UNIT/ML FlexPen Inject 8-30 Units into the skin 5 (five) times daily as needed. Sliding Scale Insulin   pantoprazole (PROTONIX) 40 MG tablet Take 40 mg by mouth  every evening.   rosuvastatin (CRESTOR) 20 MG tablet Take 20 mg by mouth every evening.    VICTOZA 18 MG/3ML SOPN Inject 1.8 mg into the skin every evening.    No current facility-administered medications for this visit. (Other)      REVIEW OF SYSTEMS: ROS   Negative for: Constitutional, Gastrointestinal, Neurological, Skin, Genitourinary, Musculoskeletal, HENT, Endocrine, Cardiovascular, Eyes, Respiratory, Psychiatric, Allergic/Imm, Heme/Lymph Last edited by [name], MD on 01/05/2022 11:31 AM.       ALLERGIES Allergies  Allergen Reactions   Cortisone Other (See Comments)    Pancreatitis    Invokana [Canagliflozin] Other (See Comments)    Patient does not tolerate (hospitalized)     PAST MEDICAL HISTORY Past Medical History:  Diagnosis Date   Diabetes mellitus without complication ([place])    Eye abnormality    edema of eye .  has had laser surgeries.    Hypercholesteremia    Hypertension    Sleep apnea    does not use CPAP   Stroke [facility]) 2011   Past Surgical History:  Procedure Laterality Date   APPENDECTOMY     BLEPHAROPLASTY Bilateral 08/2021   CHOLECYSTECTOMY     COLONOSCOPY     COLONOSCOPY N/A 01/22/2019   Procedure: COLONOSCOPY;  Surgeon: Rogene Houston, MD;  [facility];  Service: Endoscopy;  Laterality: N/A;  9:30   EYE SURGERY Left    Vitrectomy, bilateral cataracts   FRACTURE SURGERY Left    lower leg/ankle   HEMORRHOID SURGERY  2007?   PARS PLANA VITRECTOMY Right 12/10/2013   Procedure: PARS PLANA VITRECTOMY 25 GAUGE WITH ENDOLASER RIGHT EYE ;  Surgeon: [name], MD;  Location: [place];  Service: Ophthalmology;  Laterality: Right;   POLYPECTOMY  01/22/2019   Procedure: POLYPECTOMY;  Surgeon: Rogene Houston, MD;  [facility];  Service: Endoscopy;;  colon    FAMILY HISTORY History reviewed. No pertinent family history.  SOCIAL HISTORY Social History   Tobacco Use   Smoking status: Former    Types: Cigarettes     Quit date: 12/11/1987    Years since quitting: 34.0   Smokeless tobacco: Never  Vaping Use   Vaping Use: Never used  Substance Use Topics   Alcohol use: Yes    Comment: occasional   Drug use: No         OPHTHALMIC EXAM:  Base Eye Exam     Visual Acuity (ETDRS)       Right Left   Dist [place] 20/30 +2 20/30 -1         Tonometry (Tonopen, 10:41 AM)       Right Left   Pressure 16 18         Pupils       Pupils Dark Light Shape React APD   Right PERRL 6 6 Round Minimal None   Left PERRL 6 4 Round Brisk None         Visual Fields (Counting fingers)       Left Right    Full Full         Extraocular Movement       Right Left    Full Full         Neuro/Psych     Oriented x3: Yes   Mood/Affect: Normal         Dilation     Both eyes: 1.0% Mydriacyl, 2.5% Phenylephrine @ 10:41 AM           Slit Lamp and Fundus Exam     External Exam       Right Left   External Normal Normal         Slit Lamp Exam       Right Left   Lids/Lashes Normal Normal   Conjunctiva/Sclera White and quiet White and quiet   Cornea Clear Clear   Anterior Chamber Deep and quiet Deep and quiet   Iris Round and reactive Round and reactive   Lens Posterior chamber intraocular lens Posterior chamber intraocular lens   Anterior Vitreous Normal Normal         Fundus Exam       Right Left   Posterior Vitreous Normal Normal   Disc Normal Normal   C/D Ratio 0.65 0.65   Macula Microaneurysms, Macular thickening, temporal aspect of the fovea in the region of previous capillary dropout noted in angiography performed February 2021, cluster of aneurysms temporally no exudates, no macular thickening   Vessels  PDR-quiet  PDR-quiet   Periphery Normal Normal            IMAGING AND PROCEDURES  Imaging and Procedures for 01/05/22  Intravitreal Injection, Pharmacologic Agent - OD - Right Eye       Time Out 01/05/2022. 11:32 AM. Confirmed correct patient,  procedure, site, and patient consented.  Anesthesia Topical anesthesia was used. Anesthetic medications included Lidocaine 4%.   Procedure Preparation included 5% betadine to ocular surface, 10% betadine to eyelids. A 30 gauge needle was used.   Injection: 2.5 mg bevacizumab 2.5 MG/0.1ML   Route: Intravitreal, Site: Right Eye   NDC: [phone], Lot: [id number]   Post-op Post injection exam found visual acuity of at least counting fingers. The patient tolerated the procedure well. There were no complications. The patient received written and verbal post procedure care education. Post injection medications included ocuflox.      OCT, Retina - OU - Both Eyes       Right Eye Quality was good. Scan locations included subfoveal. Central Foveal Thickness: 304. Progression has improved. Findings include abnormal foveal contour, cystoid macular edema.   Left Eye Quality was good. Scan locations included subfoveal. Central Foveal Thickness: 279. Progression has been stable. Findings include normal foveal contour.   Notes Clinically significant macular edema the right eye stable at some 3 months post Avastin.  Improved from 3 months previous.  Will need repeat injection today and examination again in a 42-month exam     Color Fundus Photography Optos - OU - Both Eyes       Right Eye Progression has no prior data. Disc findings include normal observations.   Left Eye Progression has no prior data. Disc findings include increased cup to disc ratio.   Notes Bilateral proliferative diabetic retinopathy, quiescent disease             ASSESSMENT/PLAN:  Diabetic macular edema of right eye with proliferative retinopathy associated with type 2 diabetes mellitus (HCC) OD, much improved macular thickening OD on CSME by OCT, at 66-month interval post Avastin will repeat today to maintain  Stable treated proliferative diabetic retinopathy of left eye determined by examination associated  with type 2 diabetes mellitus ([place]) No active maculopathy OS     ICD-10-CM   1. Diabetic macular edema of right eye with proliferative retinopathy associated with type 2 diabetes mellitus (HCC)  E11.3511 Intravitreal Injection, Pharmacologic Agent - OD - Right Eye    OCT, Retina - OU - Both Eyes    Color Fundus Photography Optos - OU - Both Eyes    bevacizumab (AVASTIN) SOSY 2.5 mg    2. Stable treated proliferative diabetic retinopathy of left eye determined by examination associated with type 2 diabetes mellitus ([place])  K56.2563       1.  OS, no active disease  2.  OD, CSME improved post Avastin some 3 months previous repeat injection today maintain 51-month evaluation  3.  Dilate OU next  Ophthalmic Meds Ordered this visit:  Meds ordered this encounter  Medications   bevacizumab (AVASTIN) SOSY 2.5 mg       Return in about 3 months (around 04/04/2022) for DILATE OU, AVASTIN OCT, OD.  There are no Patient Instructions on file for this visit.   Explained the diagnoses, plan, and follow up with the patient and they expressed understanding.  Patient expressed understanding of the importance of proper follow up care.   Clent Demark Kapri Nero M.D. Diseases & Surgery of the Retina and Vitreous Retina & Diabetic [place] 01/05/22     Abbreviations: M myopia (nearsighted); A astigmatism; H hyperopia (farsighted); P presbyopia; Mrx spectacle prescription;  CTL contact lenses; OD right eye; OS left eye; OU both eyes  XT exotropia; ET esotropia; PEK punctate epithelial keratitis; PEE punctate epithelial erosions; DES dry eye syndrome; MGD meibomian gland dysfunction; ATs artificial  tears; PFAT's preservative free artificial tears; [place] nuclear sclerotic cataract; PSC posterior subcapsular cataract; ERM epi-retinal membrane; PVD posterior vitreous detachment; RD retinal detachment; DM diabetes mellitus; DR diabetic retinopathy; NPDR non-proliferative diabetic retinopathy; PDR proliferative  diabetic retinopathy; CSME clinically significant macular edema; DME diabetic macular edema; dbh dot blot hemorrhages; CWS cotton wool spot; POAG primary open angle glaucoma; C/D cup-to-disc ratio; HVF humphrey visual field; GVF goldmann visual field; OCT optical coherence tomography; IOP intraocular pressure; BRVO Branch retinal vein occlusion; CRVO central retinal vein occlusion; CRAO central retinal artery occlusion; BRAO branch retinal artery occlusion; RT retinal tear; SB scleral buckle; PPV pars plana vitrectomy; VH Vitreous hemorrhage; PRP panretinal laser photocoagulation; IVK intravitreal kenalog; VMT vitreomacular traction; MH Macular hole;  NVD neovascularization of the disc; NVE neovascularization elsewhere; AREDS age related eye disease study; ARMD age related macular degeneration; POAG primary open angle glaucoma; EBMD epithelial/anterior basement membrane dystrophy; ACIOL anterior chamber intraocular lens; IOL intraocular lens; PCIOL posterior chamber intraocular lens; Phaco/IOL phacoemulsification with intraocular lens placement; [place] photorefractive keratectomy; LASIK laser assisted in situ keratomileusis; HTN hypertension; DM diabetes mellitus; COPD chronic obstructive pulmonary disease

## 2022-01-05 NOTE — Assessment & Plan Note (Signed)
No active maculopathy OS

## 2022-01-05 NOTE — Assessment & Plan Note (Signed)
OD, much improved macular thickening OD on CSME by OCT, at 12-month interval post Avastin will repeat today to maintain

## 2022-01-18 DIAGNOSIS — H16223 Keratoconjunctivitis sicca, not specified as Sjogren's, bilateral: Secondary | ICD-10-CM | POA: Diagnosis not present

## 2022-02-13 DIAGNOSIS — E782 Mixed hyperlipidemia: Secondary | ICD-10-CM | POA: Diagnosis not present

## 2022-02-13 DIAGNOSIS — Z794 Long term (current) use of insulin: Secondary | ICD-10-CM | POA: Diagnosis not present

## 2022-02-13 DIAGNOSIS — E1165 Type 2 diabetes mellitus with hyperglycemia: Secondary | ICD-10-CM | POA: Diagnosis not present

## 2022-03-01 DIAGNOSIS — H16223 Keratoconjunctivitis sicca, not specified as Sjogren's, bilateral: Secondary | ICD-10-CM | POA: Diagnosis not present

## 2022-03-30 DIAGNOSIS — E113511 Type 2 diabetes mellitus with proliferative diabetic retinopathy with macular edema, right eye: Secondary | ICD-10-CM | POA: Diagnosis not present

## 2022-03-30 DIAGNOSIS — Z299 Encounter for prophylactic measures, unspecified: Secondary | ICD-10-CM | POA: Diagnosis not present

## 2022-03-30 DIAGNOSIS — Z789 Other specified health status: Secondary | ICD-10-CM | POA: Diagnosis not present

## 2022-03-30 DIAGNOSIS — I10 Essential (primary) hypertension: Secondary | ICD-10-CM | POA: Diagnosis not present

## 2022-03-30 DIAGNOSIS — G4700 Insomnia, unspecified: Secondary | ICD-10-CM | POA: Diagnosis not present

## 2022-03-30 DIAGNOSIS — E1165 Type 2 diabetes mellitus with hyperglycemia: Secondary | ICD-10-CM | POA: Diagnosis not present

## 2022-04-06 ENCOUNTER — Encounter (INDEPENDENT_AMBULATORY_CARE_PROVIDER_SITE_OTHER): Payer: Self-pay | Admitting: Ophthalmology

## 2022-04-06 ENCOUNTER — Ambulatory Visit (INDEPENDENT_AMBULATORY_CARE_PROVIDER_SITE_OTHER): Payer: Medicare Other | Admitting: Ophthalmology

## 2022-04-06 DIAGNOSIS — E113511 Type 2 diabetes mellitus with proliferative diabetic retinopathy with macular edema, right eye: Secondary | ICD-10-CM | POA: Diagnosis not present

## 2022-04-06 DIAGNOSIS — E113552 Type 2 diabetes mellitus with stable proliferative diabetic retinopathy, left eye: Secondary | ICD-10-CM

## 2022-04-06 DIAGNOSIS — G4733 Obstructive sleep apnea (adult) (pediatric): Secondary | ICD-10-CM

## 2022-04-06 MED ORDER — BEVACIZUMAB 2.5 MG/0.1ML IZ SOSY
2.5000 mg | PREFILLED_SYRINGE | INTRAVITREAL | Status: AC | PRN
  Administered 2022-04-06: 2.5 mg via INTRAVITREAL

## 2022-04-06 NOTE — Assessment & Plan Note (Signed)
No sign of recurrence of CSME OS we will continue to monitor ?

## 2022-04-06 NOTE — Assessment & Plan Note (Signed)
OD with center involved CSME has continued to remain stable and improved.  At 55-monthfollow-up today post Avastin.  We will repeat injection today and will reevaluate OU next in 4 months ?

## 2022-04-06 NOTE — Assessment & Plan Note (Signed)
Patient continues with excellent compliance on CPAP.  With excellent mask fit. ? ?Prevention of nightly hypoxia and episodic hypertension likely assists in control of diabetic CSME OU. ?

## 2022-04-06 NOTE — Progress Notes (Signed)
? ? ?04/06/2022 ? ?  ? ?CHIEF COMPLAINT ?Patient presents for  ?Chief Complaint  ?Patient presents with  ? Diabetic Retinopathy with Macular Edema  ? ? ? ? ?HISTORY OF PRESENT ILLNESS: ?Marc Haynes is a 71 y.o. male who presents to the clinic today for:  ? ?HPI   ?3 mos fu Dilate OU, Avastin OD, OCT. ?Patient states vision is stable and unchanged since last visit. Denies any new floaters or FOL. ?LBS: 73 this morning. ?Denies hospitalizations or surgeries since last visit. ?Last edited by Laurin Coder on 04/06/2022 10:32 AM.  ?  ? ? ?Referring physician: ?[name], MD ?[address] ?[place],  [place] [address] ? ?HISTORICAL INFORMATION:  ? ?Selected notes from the [facility] ?  ?   ? ?CURRENT MEDICATIONS: ?No current outpatient medications on file. (Ophthalmic Drugs)  ? ?No current facility-administered medications for this visit. (Ophthalmic Drugs)  ? ?Current Outpatient Medications (Other)  ?Medication Sig  ? acetaminophen (TYLENOL) 500 MG tablet Take 1,000 mg by mouth every 6 (six) hours as needed (for pain.).  ? citalopram (CELEXA) 40 MG tablet Take 40 mg by mouth every evening.  ? clopidogrel (PLAVIX) 75 MG tablet Take 75 mg by mouth every evening.   ? diltiazem (TIAZAC) 240 MG 24 hr capsule Take 240 mg by mouth every evening.   ? ezetimibe (ZETIA) 10 MG tablet Take 10 mg by mouth every evening.   ? hydrochlorothiazide (MICROZIDE) 12.5 MG capsule Take 12.5 mg by mouth every evening.   ? Icosapent Ethyl (VASCEPA) 1 g CAPS Take 2 g by mouth 2 (two) times daily.   ? Insulin Degludec (TRESIBA FLEXTOUCH) 200 UNIT/ML SOPN Inject 120 Units into the skin daily.   ? losartan (COZAAR) 100 MG tablet Take 100 mg by mouth daily.  ? metFORMIN (GLUCOPHAGE-XR) 500 MG 24 hr tablet Take 2,000 mg by mouth every evening.  ? Multiple Vitamin (MULTIVITAMIN WITH MINERALS) TABS tablet Take 1 tablet by mouth every evening.   ? NOVOLOG FLEXPEN 100 UNIT/ML FlexPen Inject 8-30 Units into the skin 5 (five) times daily as needed.  Sliding Scale Insulin  ? pantoprazole (PROTONIX) 40 MG tablet Take 40 mg by mouth every evening.  ? rosuvastatin (CRESTOR) 20 MG tablet Take 20 mg by mouth every evening.   ? VICTOZA 18 MG/3ML SOPN Inject 1.8 mg into the skin every evening.   ? ?No current facility-administered medications for this visit. (Other)  ? ? ? ? ?REVIEW OF SYSTEMS: ? ? ? ?ALLERGIES ?Allergies  ?Allergen Reactions  ? Cortisone Other (See Comments)  ?  Pancreatitis   ? Invokana [Canagliflozin] Other (See Comments)  ?  Patient does not tolerate (hospitalized)   ? ? ?PAST MEDICAL HISTORY ?Past Medical History:  ?Diagnosis Date  ? Diabetes mellitus without complication ([place] [place])   ? Eye abnormality   ? edema of eye .  has had laser surgeries.   ? Hypercholesteremia   ? Hypertension   ? Sleep apnea   ? does not use CPAP  ? Stroke [facility]) 2011  ? ?Past Surgical History:  ?Procedure Laterality Date  ? APPENDECTOMY    ? BLEPHAROPLASTY Bilateral 08/2021  ? CHOLECYSTECTOMY    ? COLONOSCOPY    ? COLONOSCOPY N/A 01/22/2019  ? Procedure: COLONOSCOPY;  Surgeon: Rogene Houston, MD;  [facility];  Service: Endoscopy;  Laterality: N/A;  9:30  ? EYE SURGERY Left   ? Vitrectomy, bilateral cataracts  ? FRACTURE SURGERY Left   ?  lower leg/ankle  ? HEMORRHOID SURGERY  2007?  ? PARS PLANA VITRECTOMY Right 12/10/2013  ? Procedure: PARS PLANA VITRECTOMY 25 GAUGE WITH ENDOLASER RIGHT EYE ;  Surgeon: [name], MD;  Location: [facility];  Service: Ophthalmology;  Laterality: Right;  ? POLYPECTOMY  01/22/2019  ? Procedure: POLYPECTOMY;  Surgeon: Rogene Houston, MD;  [facility];  Service: Endoscopy;;  colon  ? ? ?FAMILY HISTORY ?History reviewed. No pertinent family history. ? ?SOCIAL HISTORY ?Social History  ? ?Tobacco Use  ? Smoking status: Former  ?  Types: Cigarettes  ?  Quit date: 12/11/1987  ?  Years since quitting: 34.3  ? Smokeless tobacco: Never  ?Vaping Use  ? Vaping Use: Never used  ?Substance Use Topics  ? Alcohol use: Yes  ?  Comment:  occasional  ? Drug use: No  ? ?  ? ?  ? ?OPHTHALMIC EXAM: ? ?Base Eye Exam   ? ? Visual Acuity (ETDRS)   ? ?   Right Left  ? Dist [place] 20/25 -1 20/50 +2  ? Dist ph   10:36 AM -2  ? ?  ?  ? ? Tonometry (Tonopen, 10:36 AM)   ? ?   Right Left  ? Pressure 7 13  ? ?  ?  ? ? Pupils   ? ?   Pupils Dark Light React APD  ? Right PERRL 6 6 Minimal None  ? Left PERRL 6 4 Brisk None  ? ?  ?  ? ? Visual Fields (Counting fingers)   ? ?   Left Right  ?  Full Full  ? ?  ?  ? ? Extraocular Movement   ? ?   Right Left  ?  Full Full  ? ?  ?  ? ? Neuro/Psych   ? ? Oriented x3: Yes  ? Mood/Affect: Normal  ? ?  ?  ? ? Dilation   ? ? Both eyes: 1.0% Mydriacyl, 2.5% Phenylephrine @ 10:36 AM  ? ?  ?  ? ?  ? ?Slit Lamp and Fundus Exam   ? ? External Exam   ? ?   Right Left  ? External Normal Normal  ? ?  ?  ? ? Slit Lamp Exam   ? ?   Right Left  ? Lids/Lashes Normal Normal  ? Conjunctiva/Sclera White and quiet White and quiet  ? Cornea Clear Clear  ? Anterior Chamber Deep and quiet Deep and quiet  ? Iris Round and reactive Round and reactive  ? Lens Posterior chamber intraocular lens Posterior chamber intraocular lens  ? Anterior Vitreous Normal Normal  ? ?  ?  ? ? Fundus Exam   ? ?   Right Left  ? Posterior Vitreous Normal Normal  ? Disc Normal Normal  ? C/D Ratio 0.65 0.65  ? Macula Microaneurysms, Macular thickening, temporal aspect of the fovea in the region of previous capillary dropout noted in angiography performed February 2021, cluster of aneurysms temporally no exudates, no macular thickening  ? Vessels  PDR-quiet  PDR-quiet  ? Periphery Normal Normal  ? ?  ?  ? ?  ? ? ?IMAGING AND PROCEDURES  ?Imaging and Procedures for 04/06/22 ? ?OCT, Retina - OU - Both Eyes   ? ?   ?Right Eye ?Quality was good. Scan locations included subfoveal. Central Foveal Thickness: 272. Progression has improved. Findings include abnormal foveal contour, cystoid macular edema.  ? ?Left Eye ?Quality was good. Scan locations included subfoveal.  Central  Foveal Thickness: 280. Progression has been stable. Findings include normal foveal contour.  ? ?Notes ?Clinically significant macular edema the right eye stable at some 3 months post Avastin.  Improved from 3 months previous.  Will need repeat injection today and examination again in a 67-monthexam ? ?  ? ?Intravitreal Injection, Pharmacologic Agent - OD - Right Eye   ? ?   ?Time Out ?04/06/2022. 11:04 AM. Confirmed correct patient, procedure, site, and patient consented.  ? ?Anesthesia ?Topical anesthesia was used. Anesthetic medications included Lidocaine 4%.  ? ?Procedure ?Preparation included 5% betadine to ocular surface, 10% betadine to eyelids. A 30 gauge needle was used.  ? ?Injection: ?2.5 mg bevacizumab 2.5 MG/0.1ML ?  Route: Intravitreal, Site: Right Eye ?  [place] [id number] Lot:: [id number] ? ?Post-op ?Post injection exam found visual acuity of at least counting fingers. The patient tolerated the procedure well. There were no complications. The patient received written and verbal post procedure care education. Post injection medications included ocuflox.  ? ?  ? ? ?  ?  ? ?  ?ASSESSMENT/PLAN: ? ?Diabetic macular edema of right eye with proliferative retinopathy associated with type 2 diabetes mellitus ([place] ?OD with center involved CSME has continued to remain stable and improved.  At 339-monthollow-up today post Avastin.  We will repeat injection today and will reevaluate OU next in 4 months ? ?Obstructive sleep apnea hypopnea, severe ?Patient continues with excellent compliance on CPAP.  With excellent mask fit. ? ?Prevention of nightly hypoxia and episodic hypertension likely assists in control of diabetic CSME OU. ? ?Stable treated proliferative diabetic retinopathy of left eye determined by examination associated with type 2 diabetes mellitus ([place]?No sign of recurrence of CSME OS we will continue to monitor  ? ?  ICD-10-CM   ?1. Diabetic macular edema of right eye with proliferative retinopathy  associated with type 2 diabetes mellitus ([place] E11.3511 OCT, Retina - OU - Both Eyes  ?  Intravitreal Injection, Pharmacologic Agent - OD - Right Eye  ?  bevacizumab (AVASTIN) SOSY 2.5 mg  ?  ?2. Obstructive

## 2022-04-12 DIAGNOSIS — H16223 Keratoconjunctivitis sicca, not specified as Sjogren's, bilateral: Secondary | ICD-10-CM | POA: Diagnosis not present

## 2022-05-12 DIAGNOSIS — H1013 Acute atopic conjunctivitis, bilateral: Secondary | ICD-10-CM | POA: Diagnosis not present

## 2022-06-05 DIAGNOSIS — E1129 Type 2 diabetes mellitus with other diabetic kidney complication: Secondary | ICD-10-CM | POA: Diagnosis not present

## 2022-06-05 DIAGNOSIS — E1165 Type 2 diabetes mellitus with hyperglycemia: Secondary | ICD-10-CM | POA: Diagnosis not present

## 2022-06-05 DIAGNOSIS — Z8673 Personal history of transient ischemic attack (TIA), and cerebral infarction without residual deficits: Secondary | ICD-10-CM | POA: Diagnosis not present

## 2022-06-05 DIAGNOSIS — E1159 Type 2 diabetes mellitus with other circulatory complications: Secondary | ICD-10-CM | POA: Diagnosis not present

## 2022-06-05 DIAGNOSIS — E11319 Type 2 diabetes mellitus with unspecified diabetic retinopathy without macular edema: Secondary | ICD-10-CM | POA: Diagnosis not present

## 2022-06-05 DIAGNOSIS — Z7984 Long term (current) use of oral hypoglycemic drugs: Secondary | ICD-10-CM | POA: Diagnosis not present

## 2022-06-05 DIAGNOSIS — Z794 Long term (current) use of insulin: Secondary | ICD-10-CM | POA: Diagnosis not present

## 2022-06-05 DIAGNOSIS — E785 Hyperlipidemia, unspecified: Secondary | ICD-10-CM | POA: Diagnosis not present

## 2022-06-05 DIAGNOSIS — R809 Proteinuria, unspecified: Secondary | ICD-10-CM | POA: Diagnosis not present

## 2022-07-20 DIAGNOSIS — H16223 Keratoconjunctivitis sicca, not specified as Sjogren's, bilateral: Secondary | ICD-10-CM | POA: Diagnosis not present

## 2022-08-03 DIAGNOSIS — Z299 Encounter for prophylactic measures, unspecified: Secondary | ICD-10-CM | POA: Diagnosis not present

## 2022-08-03 DIAGNOSIS — E1165 Type 2 diabetes mellitus with hyperglycemia: Secondary | ICD-10-CM | POA: Diagnosis not present

## 2022-08-03 DIAGNOSIS — G4700 Insomnia, unspecified: Secondary | ICD-10-CM | POA: Diagnosis not present

## 2022-08-03 DIAGNOSIS — R5383 Other fatigue: Secondary | ICD-10-CM | POA: Diagnosis not present

## 2022-08-03 DIAGNOSIS — I10 Essential (primary) hypertension: Secondary | ICD-10-CM | POA: Diagnosis not present

## 2022-08-07 ENCOUNTER — Encounter (INDEPENDENT_AMBULATORY_CARE_PROVIDER_SITE_OTHER): Payer: Medicare Other | Admitting: Ophthalmology

## 2022-08-14 ENCOUNTER — Encounter (INDEPENDENT_AMBULATORY_CARE_PROVIDER_SITE_OTHER): Payer: Medicare Other | Admitting: Ophthalmology

## 2022-08-18 DIAGNOSIS — R5383 Other fatigue: Secondary | ICD-10-CM | POA: Diagnosis not present

## 2022-08-18 DIAGNOSIS — Z79899 Other long term (current) drug therapy: Secondary | ICD-10-CM | POA: Diagnosis not present

## 2022-08-29 DIAGNOSIS — G245 Blepharospasm: Secondary | ICD-10-CM | POA: Diagnosis not present

## 2022-08-30 ENCOUNTER — Encounter (INDEPENDENT_AMBULATORY_CARE_PROVIDER_SITE_OTHER): Payer: Self-pay | Admitting: Ophthalmology

## 2022-08-30 ENCOUNTER — Ambulatory Visit (INDEPENDENT_AMBULATORY_CARE_PROVIDER_SITE_OTHER): Payer: Medicare Other | Admitting: Ophthalmology

## 2022-08-30 DIAGNOSIS — E113511 Type 2 diabetes mellitus with proliferative diabetic retinopathy with macular edema, right eye: Secondary | ICD-10-CM

## 2022-08-30 DIAGNOSIS — H35071 Retinal telangiectasis, right eye: Secondary | ICD-10-CM | POA: Diagnosis not present

## 2022-08-30 DIAGNOSIS — E113552 Type 2 diabetes mellitus with stable proliferative diabetic retinopathy, left eye: Secondary | ICD-10-CM

## 2022-08-30 DIAGNOSIS — G4733 Obstructive sleep apnea (adult) (pediatric): Secondary | ICD-10-CM

## 2022-08-30 NOTE — Assessment & Plan Note (Signed)
Continues on CPAP

## 2022-08-30 NOTE — Assessment & Plan Note (Signed)
Stable PDR, no CSME

## 2022-08-30 NOTE — Assessment & Plan Note (Signed)
Patient compliant on CPAP use.  May account for lengthening duration between treatments of CSME which has been recurrent.

## 2022-08-30 NOTE — Assessment & Plan Note (Signed)
Today at 5 months post most recent injection, therefore in the absence of recurrent CSME OD likely stabilized by prevention of nightly hypoxia from untreated sleep apnea in the past, we will simply observe today

## 2022-08-30 NOTE — Progress Notes (Signed)
08/30/2022     CHIEF COMPLAINT Patient presents for  Chief Complaint  Patient presents with   Diabetic Retinopathy with Macular Edema      HISTORY OF PRESENT ILLNESS: Marc Haynes is a 71 y.o. male who presents to the clinic today for:   HPI   4 MOS FOR DILATE OU, COLOR FP, AVASTIN OCT, OD. Pt stated vision has remained stable since last visit. A1C: 6.2 Pts current blood sugar is 149.  Last edited by Silvestre Moment on 08/30/2022 10:27 AM.      Referring physician: [name], MD [address] [place],  [place] [address]  HISTORICAL INFORMATION:   Selected notes from the [facility]: No current outpatient medications on file. (Ophthalmic Drugs)   No current facility-administered medications for this visit. (Ophthalmic Drugs)   Current Outpatient Medications (Other)  Medication Sig   acetaminophen (TYLENOL) 500 MG tablet Take 1,000 mg by mouth every 6 (six) hours as needed (for pain.).   citalopram (CELEXA) 40 MG tablet Take 40 mg by mouth every evening.   clopidogrel (PLAVIX) 75 MG tablet Take 75 mg by mouth every evening.    diltiazem (TIAZAC) 240 MG 24 hr capsule Take 240 mg by mouth every evening.    ezetimibe (ZETIA) 10 MG tablet Take 10 mg by mouth every evening.    hydrochlorothiazide (MICROZIDE) 12.5 MG capsule Take 12.5 mg by mouth every evening.    Icosapent Ethyl (VASCEPA) 1 g CAPS Take 2 g by mouth 2 (two) times daily.    Insulin Degludec (TRESIBA FLEXTOUCH) 200 UNIT/ML SOPN Inject 120 Units into the skin daily.    losartan (COZAAR) 100 MG tablet Take 100 mg by mouth daily.   metFORMIN (GLUCOPHAGE-XR) 500 MG 24 hr tablet Take 2,000 mg by mouth every evening.   Multiple Vitamin (MULTIVITAMIN WITH MINERALS) TABS tablet Take 1 tablet by mouth every evening.    NOVOLOG FLEXPEN 100 UNIT/ML FlexPen Inject 8-30 Units into the skin 5 (five) times daily as needed. Sliding Scale Insulin   pantoprazole (PROTONIX) 40 MG tablet Take 40 mg  by mouth every evening.   rosuvastatin (CRESTOR) 20 MG tablet Take 20 mg by mouth every evening.    VICTOZA 18 MG/3ML SOPN Inject 1.8 mg into the skin every evening.    No current facility-administered medications for this visit. (Other)      REVIEW OF SYSTEMS: ROS   Negative for: Constitutional, Gastrointestinal, Neurological, Skin, Genitourinary, Musculoskeletal, HENT, Endocrine, Cardiovascular, Eyes, Respiratory, Psychiatric, Allergic/Imm, Heme/Lymph Last edited by Silvestre Moment on 08/30/2022 10:27 AM.       ALLERGIES Allergies  Allergen Reactions   Cortisone Other (See Comments)    Pancreatitis    Invokana [Canagliflozin] Other (See Comments)    Patient does not tolerate (hospitalized)     PAST MEDICAL HISTORY Past Medical History:  Diagnosis Date   Diabetes mellitus without complication ([facility] [place])    Eye abnormality    edema of eye .  has had laser surgeries.    Hypercholesteremia    Hypertension    Sleep apnea    does not use CPAP   Stroke [facility]) 2011   Past Surgical History:  Procedure Laterality Date   APPENDECTOMY     BLEPHAROPLASTY Bilateral 08/2021   CHOLECYSTECTOMY     COLONOSCOPY     COLONOSCOPY N/A 01/22/2019   Procedure: COLONOSCOPY;  Surgeon: Rogene Houston, MD;  [facility];  Service: Endoscopy;  Laterality: N/A;  9:30   EYE SURGERY Left    Vitrectomy, bilateral cataracts   FRACTURE SURGERY Left    lower leg/ankle   HEMORRHOID SURGERY  2007?   PARS PLANA VITRECTOMY Right 12/10/2013   Procedure: PARS PLANA VITRECTOMY 25 GAUGE WITH ENDOLASER RIGHT EYE ;  Surgeon: [name], MD;  Location: [facility];  Service: Ophthalmology;  Laterality: Right;   POLYPECTOMY  01/22/2019   Procedure: POLYPECTOMY;  Surgeon: Rogene Houston, MD;  [facility];  Service: Endoscopy;;  colon    FAMILY HISTORY History reviewed. No pertinent family history.  SOCIAL HISTORY Social History   Tobacco Use   Smoking status: Former    Types: Cigarettes     Quit date: 12/11/1987    Years since quitting: 34.7   Smokeless tobacco: Never  Vaping Use   Vaping Use: Never used  Substance Use Topics   Alcohol use: Yes    Comment: occasional   Drug use: No         OPHTHALMIC EXAM:  Base Eye Exam     Visual Acuity (ETDRS)       Right Left   Dist [place] 20/25 -2 20/50 -2   Dist ph [place]  20/30 -1         Tonometry (Tonopen, 10:33 AM)       Right Left   Pressure 16 17         Pupils       Pupils APD   Right PERRL None   Left PERRL None         Visual Fields       Left Right    Full Full         Extraocular Movement       Right Left    Full, Ortho Full, Ortho         Neuro/Psych     Oriented x3: Yes   Mood/Affect: Normal         Dilation     Both eyes: 1.0% Mydriacyl, 2.5% Phenylephrine @ 10:33 AM           Slit Lamp and Fundus Exam     External Exam       Right Left   External Normal Normal         Slit Lamp Exam       Right Left   Lids/Lashes Normal Normal   Conjunctiva/Sclera White and quiet White and quiet   Cornea Clear Clear   Anterior Chamber Deep and quiet Deep and quiet   Iris Round and reactive Round and reactive   Lens Posterior chamber intraocular lens Posterior chamber intraocular lens   Anterior Vitreous Normal Normal         Fundus Exam       Right Left   Posterior Vitreous Normal Normal   Disc Normal Normal   C/D Ratio 0.65 0.65   Macula Microaneurysms, Macular thickening, temporal aspect of the fovea in the region of previous capillary dropout noted in angiography performed February 2021, cluster of aneurysms temporally no exudates, no macular thickening   Vessels  PDR-quiet  PDR-quiet   Periphery Normal Normal            IMAGING AND PROCEDURES  Imaging and Procedures for 08/30/22  OCT, Retina - OU - Both Eyes       Right Eye Quality was good. Scan locations included subfoveal. Central Foveal Thickness: 258. Progression has improved. Findings include  abnormal foveal contour,  epiretinal membrane.   Left Eye Quality was good. Scan locations included subfoveal. Central Foveal Thickness: 275. Progression has been stable. Findings include normal foveal contour.   Notes Clinically significant macular edema the right eye stable at some 5 months post Avastin.  Improved from 5 months previous.  No active disease OD      Color Fundus Photography Optos - OU - Both Eyes       Right Eye Progression has no prior data. Disc findings include normal observations.   Left Eye Progression has no prior data. Disc findings include increased cup to disc ratio.   Notes Bilateral proliferative diabetic retinopathy, quiescent disease              ASSESSMENT/PLAN:  Obstructive sleep apnea hypopnea, severe Patient compliant on CPAP use.  May account for lengthening duration between treatments of CSME which has been recurrent.  Diabetic macular edema of right eye with proliferative retinopathy associated with type 2 diabetes mellitus (HCC) Today at 5 months post most recent injection, therefore in the absence of recurrent CSME OD likely stabilized by prevention of nightly hypoxia from untreated sleep apnea in the past, we will simply observe today  Stable treated proliferative diabetic retinopathy of left eye determined by examination associated with type 2 diabetes mellitus (HCC) Stable PDR, no CSME  Retinal telangiectasis of right eye Continues on CPAP     ICD-10-CM   1. Diabetic macular edema of right eye with proliferative retinopathy associated with type 2 diabetes mellitus (HCC)  E11.3511 OCT, Retina - OU - Both Eyes    Color Fundus Photography Optos - OU - Both Eyes    2. Obstructive sleep apnea hypopnea, severe  G47.33     3. Stable treated proliferative diabetic retinopathy of left eye determined by examination associated with type 2 diabetes mellitus ([facility])  K53.9767     4. Retinal telangiectasis of right eye  H35.071        1.  OU with quiescent PDR.  2.  OD with had a recurrence of CSME in the past but has now been controlled and today at 60-monthfollow-up, no sign of recurrence of CSME thus we will not need to treat with Avastin OD today.  3.  Continues on effective CPAP usage that is preventing nightly hypoxic damage to the retina optic nerve as well as systemic effects to the CNS and cardiovascular systems  Ophthalmic Meds Ordered this visit:  No orders of the defined types were placed in this encounter.      Return in about 6 months (around 03/01/2023) for DILATE OU, COLOR FP, OCT.  There are no Patient Instructions on file for this visit.   Explained the diagnoses, plan, and follow up with the patient and they expressed understanding.  Patient expressed understanding of the importance of proper follow up care.   GClent DemarkRankin M.D. Diseases & Surgery of the Retina and Vitreous Retina & Diabetic EMontfort10/04/23     Abbreviations: M myopia (nearsighted); A astigmatism; H hyperopia (farsighted); P presbyopia; Mrx spectacle prescription;  CTL contact lenses; OD right eye; OS left eye; OU both eyes  XT exotropia; ET esotropia; PEK punctate epithelial keratitis; PEE punctate epithelial erosions; DES dry eye syndrome; MGD meibomian gland dysfunction; ATs artificial tears; PFAT's preservative free artificial tears; NSargentnuclear sclerotic cataract; PSC posterior subcapsular cataract; ERM epi-retinal membrane; PVD posterior vitreous detachment; RD retinal detachment; DM diabetes mellitus; DR diabetic retinopathy; NPDR non-proliferative diabetic retinopathy; PDR proliferative diabetic retinopathy; CSME clinically  significant macular edema; DME diabetic macular edema; dbh dot blot hemorrhages; CWS cotton wool spot; POAG primary open angle glaucoma; C/D cup-to-disc ratio; HVF humphrey visual field; GVF goldmann visual field; OCT optical coherence tomography; IOP intraocular pressure; BRVO Branch retinal vein  occlusion; CRVO central retinal vein occlusion; CRAO central retinal artery occlusion; BRAO branch retinal artery occlusion; RT retinal tear; SB scleral buckle; PPV pars plana vitrectomy; VH Vitreous hemorrhage; PRP panretinal laser photocoagulation; IVK intravitreal kenalog; VMT vitreomacular traction; MH Macular hole;  NVD neovascularization of the disc; NVE neovascularization elsewhere; AREDS age related eye disease study; ARMD age related macular degeneration; POAG primary open angle glaucoma; EBMD epithelial/anterior basement membrane dystrophy; ACIOL anterior chamber intraocular lens; IOL intraocular lens; PCIOL posterior chamber intraocular lens; Phaco/IOL phacoemulsification with intraocular lens placement; [place] photorefractive keratectomy; LASIK laser assisted in situ keratomileusis; HTN hypertension; DM diabetes mellitus; COPD chronic obstructive pulmonary disease

## 2022-09-05 DIAGNOSIS — M79673 Pain in unspecified foot: Secondary | ICD-10-CM | POA: Diagnosis not present

## 2022-09-05 DIAGNOSIS — M722 Plantar fascial fibromatosis: Secondary | ICD-10-CM | POA: Diagnosis not present

## 2022-10-09 DIAGNOSIS — R809 Proteinuria, unspecified: Secondary | ICD-10-CM | POA: Diagnosis not present

## 2022-10-09 DIAGNOSIS — E785 Hyperlipidemia, unspecified: Secondary | ICD-10-CM | POA: Diagnosis not present

## 2022-10-09 DIAGNOSIS — Z7984 Long term (current) use of oral hypoglycemic drugs: Secondary | ICD-10-CM | POA: Diagnosis not present

## 2022-10-09 DIAGNOSIS — E782 Mixed hyperlipidemia: Secondary | ICD-10-CM | POA: Diagnosis not present

## 2022-10-09 DIAGNOSIS — E1165 Type 2 diabetes mellitus with hyperglycemia: Secondary | ICD-10-CM | POA: Diagnosis not present

## 2022-10-09 DIAGNOSIS — E11311 Type 2 diabetes mellitus with unspecified diabetic retinopathy with macular edema: Secondary | ICD-10-CM | POA: Diagnosis not present

## 2022-10-09 DIAGNOSIS — E1129 Type 2 diabetes mellitus with other diabetic kidney complication: Secondary | ICD-10-CM | POA: Diagnosis not present

## 2022-10-09 DIAGNOSIS — Z794 Long term (current) use of insulin: Secondary | ICD-10-CM | POA: Diagnosis not present

## 2022-10-09 DIAGNOSIS — Z8673 Personal history of transient ischemic attack (TIA), and cerebral infarction without residual deficits: Secondary | ICD-10-CM | POA: Diagnosis not present

## 2022-10-18 DIAGNOSIS — Z299 Encounter for prophylactic measures, unspecified: Secondary | ICD-10-CM | POA: Diagnosis not present

## 2022-10-18 DIAGNOSIS — Z1331 Encounter for screening for depression: Secondary | ICD-10-CM | POA: Diagnosis not present

## 2022-10-18 DIAGNOSIS — Z87891 Personal history of nicotine dependence: Secondary | ICD-10-CM | POA: Diagnosis not present

## 2022-10-18 DIAGNOSIS — Z0000 Encounter for general adult medical examination without abnormal findings: Secondary | ICD-10-CM | POA: Diagnosis not present

## 2022-10-18 DIAGNOSIS — Z1339 Encounter for screening examination for other mental health and behavioral disorders: Secondary | ICD-10-CM | POA: Diagnosis not present

## 2022-10-18 DIAGNOSIS — Z23 Encounter for immunization: Secondary | ICD-10-CM | POA: Diagnosis not present

## 2022-10-18 DIAGNOSIS — Z6830 Body mass index (BMI) 30.0-30.9, adult: Secondary | ICD-10-CM | POA: Diagnosis not present

## 2022-10-18 DIAGNOSIS — Z7189 Other specified counseling: Secondary | ICD-10-CM | POA: Diagnosis not present

## 2022-10-18 DIAGNOSIS — Z125 Encounter for screening for malignant neoplasm of prostate: Secondary | ICD-10-CM | POA: Diagnosis not present

## 2022-10-18 DIAGNOSIS — I10 Essential (primary) hypertension: Secondary | ICD-10-CM | POA: Diagnosis not present

## 2022-12-06 DIAGNOSIS — E113511 Type 2 diabetes mellitus with proliferative diabetic retinopathy with macular edema, right eye: Secondary | ICD-10-CM | POA: Diagnosis not present

## 2022-12-06 DIAGNOSIS — I10 Essential (primary) hypertension: Secondary | ICD-10-CM | POA: Diagnosis not present

## 2022-12-06 DIAGNOSIS — D692 Other nonthrombocytopenic purpura: Secondary | ICD-10-CM | POA: Diagnosis not present

## 2022-12-06 DIAGNOSIS — E1165 Type 2 diabetes mellitus with hyperglycemia: Secondary | ICD-10-CM | POA: Diagnosis not present

## 2022-12-06 DIAGNOSIS — Z299 Encounter for prophylactic measures, unspecified: Secondary | ICD-10-CM | POA: Diagnosis not present

## 2023-02-08 DIAGNOSIS — E119 Type 2 diabetes mellitus without complications: Secondary | ICD-10-CM | POA: Diagnosis not present

## 2023-02-12 DIAGNOSIS — Z794 Long term (current) use of insulin: Secondary | ICD-10-CM | POA: Diagnosis not present

## 2023-02-12 DIAGNOSIS — E785 Hyperlipidemia, unspecified: Secondary | ICD-10-CM | POA: Diagnosis not present

## 2023-02-12 DIAGNOSIS — E11311 Type 2 diabetes mellitus with unspecified diabetic retinopathy with macular edema: Secondary | ICD-10-CM | POA: Diagnosis not present

## 2023-02-12 DIAGNOSIS — Z7984 Long term (current) use of oral hypoglycemic drugs: Secondary | ICD-10-CM | POA: Diagnosis not present

## 2023-02-12 DIAGNOSIS — E1165 Type 2 diabetes mellitus with hyperglycemia: Secondary | ICD-10-CM | POA: Diagnosis not present

## 2023-03-01 ENCOUNTER — Encounter (INDEPENDENT_AMBULATORY_CARE_PROVIDER_SITE_OTHER): Payer: Medicare Other | Admitting: Ophthalmology

## 2023-03-07 DIAGNOSIS — Z299 Encounter for prophylactic measures, unspecified: Secondary | ICD-10-CM | POA: Diagnosis not present

## 2023-03-07 DIAGNOSIS — J069 Acute upper respiratory infection, unspecified: Secondary | ICD-10-CM | POA: Diagnosis not present

## 2023-03-07 DIAGNOSIS — R0981 Nasal congestion: Secondary | ICD-10-CM | POA: Diagnosis not present

## 2023-03-16 DIAGNOSIS — J069 Acute upper respiratory infection, unspecified: Secondary | ICD-10-CM | POA: Diagnosis not present

## 2023-03-16 DIAGNOSIS — R0989 Other specified symptoms and signs involving the circulatory and respiratory systems: Secondary | ICD-10-CM | POA: Diagnosis not present

## 2023-03-16 DIAGNOSIS — E1165 Type 2 diabetes mellitus with hyperglycemia: Secondary | ICD-10-CM | POA: Diagnosis not present

## 2023-03-16 DIAGNOSIS — R062 Wheezing: Secondary | ICD-10-CM | POA: Diagnosis not present

## 2023-03-16 DIAGNOSIS — R059 Cough, unspecified: Secondary | ICD-10-CM | POA: Diagnosis not present

## 2023-03-16 DIAGNOSIS — Z299 Encounter for prophylactic measures, unspecified: Secondary | ICD-10-CM | POA: Diagnosis not present

## 2023-03-20 DIAGNOSIS — E1142 Type 2 diabetes mellitus with diabetic polyneuropathy: Secondary | ICD-10-CM | POA: Diagnosis not present

## 2023-03-20 DIAGNOSIS — L84 Corns and callosities: Secondary | ICD-10-CM | POA: Diagnosis not present

## 2023-03-20 DIAGNOSIS — M79672 Pain in left foot: Secondary | ICD-10-CM | POA: Diagnosis not present

## 2023-03-26 DIAGNOSIS — I10 Essential (primary) hypertension: Secondary | ICD-10-CM | POA: Diagnosis not present

## 2023-03-26 DIAGNOSIS — J069 Acute upper respiratory infection, unspecified: Secondary | ICD-10-CM | POA: Diagnosis not present

## 2023-03-26 DIAGNOSIS — J302 Other seasonal allergic rhinitis: Secondary | ICD-10-CM | POA: Diagnosis not present

## 2023-03-26 DIAGNOSIS — Z299 Encounter for prophylactic measures, unspecified: Secondary | ICD-10-CM | POA: Diagnosis not present

## 2023-04-11 DIAGNOSIS — Z299 Encounter for prophylactic measures, unspecified: Secondary | ICD-10-CM | POA: Diagnosis not present

## 2023-04-11 DIAGNOSIS — G4700 Insomnia, unspecified: Secondary | ICD-10-CM | POA: Diagnosis not present

## 2023-04-11 DIAGNOSIS — E1140 Type 2 diabetes mellitus with diabetic neuropathy, unspecified: Secondary | ICD-10-CM | POA: Diagnosis not present

## 2023-04-11 DIAGNOSIS — I10 Essential (primary) hypertension: Secondary | ICD-10-CM | POA: Diagnosis not present

## 2023-04-11 DIAGNOSIS — K219 Gastro-esophageal reflux disease without esophagitis: Secondary | ICD-10-CM | POA: Diagnosis not present

## 2023-04-11 DIAGNOSIS — E1165 Type 2 diabetes mellitus with hyperglycemia: Secondary | ICD-10-CM | POA: Diagnosis not present

## 2023-07-12 DIAGNOSIS — E113553 Type 2 diabetes mellitus with stable proliferative diabetic retinopathy, bilateral: Secondary | ICD-10-CM | POA: Diagnosis not present

## 2023-07-12 DIAGNOSIS — H35073 Retinal telangiectasis, bilateral: Secondary | ICD-10-CM | POA: Diagnosis not present

## 2023-08-01 DIAGNOSIS — Z299 Encounter for prophylactic measures, unspecified: Secondary | ICD-10-CM | POA: Diagnosis not present

## 2023-08-01 DIAGNOSIS — I10 Essential (primary) hypertension: Secondary | ICD-10-CM | POA: Diagnosis not present

## 2023-08-01 DIAGNOSIS — E1140 Type 2 diabetes mellitus with diabetic neuropathy, unspecified: Secondary | ICD-10-CM | POA: Diagnosis not present

## 2023-08-01 DIAGNOSIS — E1165 Type 2 diabetes mellitus with hyperglycemia: Secondary | ICD-10-CM | POA: Diagnosis not present

## 2023-08-01 DIAGNOSIS — F3342 Major depressive disorder, recurrent, in full remission: Secondary | ICD-10-CM | POA: Diagnosis not present

## 2023-08-01 DIAGNOSIS — G4700 Insomnia, unspecified: Secondary | ICD-10-CM | POA: Diagnosis not present

## 2023-08-20 DIAGNOSIS — E1165 Type 2 diabetes mellitus with hyperglycemia: Secondary | ICD-10-CM | POA: Diagnosis not present

## 2023-08-20 DIAGNOSIS — E782 Mixed hyperlipidemia: Secondary | ICD-10-CM | POA: Diagnosis not present

## 2023-08-20 DIAGNOSIS — Z794 Long term (current) use of insulin: Secondary | ICD-10-CM | POA: Diagnosis not present

## 2023-09-07 DIAGNOSIS — Z23 Encounter for immunization: Secondary | ICD-10-CM | POA: Diagnosis not present

## 2023-09-25 DIAGNOSIS — Z23 Encounter for immunization: Secondary | ICD-10-CM | POA: Diagnosis not present

## 2023-10-04 DIAGNOSIS — Z1339 Encounter for screening examination for other mental health and behavioral disorders: Secondary | ICD-10-CM | POA: Diagnosis not present

## 2023-10-04 DIAGNOSIS — Z79899 Other long term (current) drug therapy: Secondary | ICD-10-CM | POA: Diagnosis not present

## 2023-10-04 DIAGNOSIS — Z6830 Body mass index (BMI) 30.0-30.9, adult: Secondary | ICD-10-CM | POA: Diagnosis not present

## 2023-10-04 DIAGNOSIS — Z1331 Encounter for screening for depression: Secondary | ICD-10-CM | POA: Diagnosis not present

## 2023-10-04 DIAGNOSIS — Z7189 Other specified counseling: Secondary | ICD-10-CM | POA: Diagnosis not present

## 2023-10-04 DIAGNOSIS — I10 Essential (primary) hypertension: Secondary | ICD-10-CM | POA: Diagnosis not present

## 2023-10-04 DIAGNOSIS — Z125 Encounter for screening for malignant neoplasm of prostate: Secondary | ICD-10-CM | POA: Diagnosis not present

## 2023-10-04 DIAGNOSIS — Z87891 Personal history of nicotine dependence: Secondary | ICD-10-CM | POA: Diagnosis not present

## 2023-10-04 DIAGNOSIS — Z299 Encounter for prophylactic measures, unspecified: Secondary | ICD-10-CM | POA: Diagnosis not present

## 2023-10-04 DIAGNOSIS — Z0000 Encounter for general adult medical examination without abnormal findings: Secondary | ICD-10-CM | POA: Diagnosis not present

## 2023-10-04 DIAGNOSIS — E7800 Pure hypercholesterolemia, unspecified: Secondary | ICD-10-CM | POA: Diagnosis not present

## 2023-10-04 DIAGNOSIS — R5383 Other fatigue: Secondary | ICD-10-CM | POA: Diagnosis not present

## 2023-11-13 DIAGNOSIS — E1165 Type 2 diabetes mellitus with hyperglycemia: Secondary | ICD-10-CM | POA: Diagnosis not present

## 2023-11-13 DIAGNOSIS — I10 Essential (primary) hypertension: Secondary | ICD-10-CM | POA: Diagnosis not present

## 2023-11-13 DIAGNOSIS — Z299 Encounter for prophylactic measures, unspecified: Secondary | ICD-10-CM | POA: Diagnosis not present

## 2023-11-13 DIAGNOSIS — F419 Anxiety disorder, unspecified: Secondary | ICD-10-CM | POA: Diagnosis not present

## 2023-11-22 DIAGNOSIS — I10 Essential (primary) hypertension: Secondary | ICD-10-CM | POA: Diagnosis not present

## 2023-11-22 DIAGNOSIS — R519 Headache, unspecified: Secondary | ICD-10-CM | POA: Diagnosis not present

## 2023-11-22 DIAGNOSIS — J029 Acute pharyngitis, unspecified: Secondary | ICD-10-CM | POA: Diagnosis not present

## 2023-11-22 DIAGNOSIS — R079 Chest pain, unspecified: Secondary | ICD-10-CM | POA: Diagnosis not present

## 2023-11-22 DIAGNOSIS — H7092 Unspecified mastoiditis, left ear: Secondary | ICD-10-CM | POA: Diagnosis not present

## 2023-11-22 DIAGNOSIS — Z299 Encounter for prophylactic measures, unspecified: Secondary | ICD-10-CM | POA: Diagnosis not present

## 2023-11-23 DIAGNOSIS — Z299 Encounter for prophylactic measures, unspecified: Secondary | ICD-10-CM | POA: Diagnosis not present

## 2023-11-23 DIAGNOSIS — J069 Acute upper respiratory infection, unspecified: Secondary | ICD-10-CM | POA: Diagnosis not present

## 2023-11-23 DIAGNOSIS — I10 Essential (primary) hypertension: Secondary | ICD-10-CM | POA: Diagnosis not present

## 2023-12-31 DIAGNOSIS — M79674 Pain in right toe(s): Secondary | ICD-10-CM | POA: Diagnosis not present

## 2023-12-31 DIAGNOSIS — E7800 Pure hypercholesterolemia, unspecified: Secondary | ICD-10-CM | POA: Diagnosis not present

## 2023-12-31 DIAGNOSIS — E786 Lipoprotein deficiency: Secondary | ICD-10-CM | POA: Diagnosis not present

## 2023-12-31 DIAGNOSIS — Z299 Encounter for prophylactic measures, unspecified: Secondary | ICD-10-CM | POA: Diagnosis not present

## 2023-12-31 DIAGNOSIS — I10 Essential (primary) hypertension: Secondary | ICD-10-CM | POA: Diagnosis not present

## 2024-02-27 DIAGNOSIS — E1165 Type 2 diabetes mellitus with hyperglycemia: Secondary | ICD-10-CM | POA: Diagnosis not present

## 2024-02-27 DIAGNOSIS — Z299 Encounter for prophylactic measures, unspecified: Secondary | ICD-10-CM | POA: Diagnosis not present

## 2024-02-27 DIAGNOSIS — R5383 Other fatigue: Secondary | ICD-10-CM | POA: Diagnosis not present

## 2024-02-27 DIAGNOSIS — I10 Essential (primary) hypertension: Secondary | ICD-10-CM | POA: Diagnosis not present

## 2024-03-10 DIAGNOSIS — Z8673 Personal history of transient ischemic attack (TIA), and cerebral infarction without residual deficits: Secondary | ICD-10-CM | POA: Diagnosis not present

## 2024-03-10 DIAGNOSIS — Z794 Long term (current) use of insulin: Secondary | ICD-10-CM | POA: Diagnosis not present

## 2024-03-10 DIAGNOSIS — Z7984 Long term (current) use of oral hypoglycemic drugs: Secondary | ICD-10-CM | POA: Diagnosis not present

## 2024-03-10 DIAGNOSIS — Z978 Presence of other specified devices: Secondary | ICD-10-CM | POA: Diagnosis not present

## 2024-03-10 DIAGNOSIS — E11311 Type 2 diabetes mellitus with unspecified diabetic retinopathy with macular edema: Secondary | ICD-10-CM | POA: Diagnosis not present

## 2024-03-10 DIAGNOSIS — E785 Hyperlipidemia, unspecified: Secondary | ICD-10-CM | POA: Diagnosis not present

## 2024-03-10 DIAGNOSIS — E118 Type 2 diabetes mellitus with unspecified complications: Secondary | ICD-10-CM | POA: Diagnosis not present

## 2024-04-09 DIAGNOSIS — H35073 Retinal telangiectasis, bilateral: Secondary | ICD-10-CM | POA: Diagnosis not present

## 2024-04-09 DIAGNOSIS — E113593 Type 2 diabetes mellitus with proliferative diabetic retinopathy without macular edema, bilateral: Secondary | ICD-10-CM | POA: Diagnosis not present

## 2024-06-02 DIAGNOSIS — E113553 Type 2 diabetes mellitus with stable proliferative diabetic retinopathy, bilateral: Secondary | ICD-10-CM | POA: Diagnosis not present

## 2024-06-02 DIAGNOSIS — R109 Unspecified abdominal pain: Secondary | ICD-10-CM | POA: Diagnosis not present

## 2024-06-02 DIAGNOSIS — E1165 Type 2 diabetes mellitus with hyperglycemia: Secondary | ICD-10-CM | POA: Diagnosis not present

## 2024-06-02 DIAGNOSIS — I10 Essential (primary) hypertension: Secondary | ICD-10-CM | POA: Diagnosis not present

## 2024-06-02 DIAGNOSIS — Z299 Encounter for prophylactic measures, unspecified: Secondary | ICD-10-CM | POA: Diagnosis not present

## 2024-06-03 ENCOUNTER — Encounter (INDEPENDENT_AMBULATORY_CARE_PROVIDER_SITE_OTHER): Payer: Self-pay | Admitting: *Deleted

## 2024-06-03 DIAGNOSIS — R109 Unspecified abdominal pain: Secondary | ICD-10-CM | POA: Diagnosis not present

## 2024-06-05 ENCOUNTER — Other Ambulatory Visit: Payer: Self-pay

## 2024-06-05 ENCOUNTER — Encounter (HOSPITAL_COMMUNITY): Payer: Self-pay

## 2024-06-05 ENCOUNTER — Emergency Department (HOSPITAL_COMMUNITY)
Admission: EM | Admit: 2024-06-05 | Discharge: 2024-06-05 | Discharge status: Against Medical Advice | Source: Ambulatory Visit | Attending: Emergency Medicine | Admitting: Emergency Medicine

## 2024-06-05 DIAGNOSIS — I10 Essential (primary) hypertension: Secondary | ICD-10-CM | POA: Diagnosis not present

## 2024-06-05 DIAGNOSIS — Z5321 Procedure and treatment not carried out due to patient leaving prior to being seen by health care provider: Secondary | ICD-10-CM | POA: Diagnosis not present

## 2024-06-05 DIAGNOSIS — K219 Gastro-esophageal reflux disease without esophagitis: Secondary | ICD-10-CM | POA: Diagnosis not present

## 2024-06-05 DIAGNOSIS — R1084 Generalized abdominal pain: Secondary | ICD-10-CM | POA: Diagnosis not present

## 2024-06-05 DIAGNOSIS — R109 Unspecified abdominal pain: Secondary | ICD-10-CM | POA: Diagnosis not present

## 2024-06-05 DIAGNOSIS — Z299 Encounter for prophylactic measures, unspecified: Secondary | ICD-10-CM | POA: Diagnosis not present

## 2024-06-05 DIAGNOSIS — R079 Chest pain, unspecified: Secondary | ICD-10-CM | POA: Diagnosis not present

## 2024-06-05 LAB — CBC WITH DIFFERENTIAL/PLATELET
Abs Immature Granulocytes: 0.13 K/uL — ABNORMAL HIGH (ref 0.00–0.07)
Basophils Absolute: 0.1 K/uL (ref 0.0–0.1)
Basophils Relative: 1 %
Eosinophils Absolute: 0.2 K/uL (ref 0.0–0.5)
Eosinophils Relative: 2 %
HCT: 55.7 % — ABNORMAL HIGH (ref 39.0–52.0)
Hemoglobin: 18.1 g/dL — ABNORMAL HIGH (ref 13.0–17.0)
Immature Granulocytes: 1 %
Lymphocytes Relative: 22 %
Lymphs Abs: 2 K/uL (ref 0.7–4.0)
MCH: 29.3 pg (ref 26.0–34.0)
MCHC: 32.5 g/dL (ref 30.0–36.0)
MCV: 90.3 fL (ref 80.0–100.0)
Monocytes Absolute: 1 K/uL (ref 0.1–1.0)
Monocytes Relative: 10 %
Neutro Abs: 6 K/uL (ref 1.7–7.7)
Neutrophils Relative %: 64 %
Platelets: 212 K/uL (ref 150–400)
RBC: 6.17 MIL/uL — ABNORMAL HIGH (ref 4.22–5.81)
RDW: 14.4 % (ref 11.5–15.5)
WBC: 9.3 K/uL (ref 4.0–10.5)
nRBC: 0 % (ref 0.0–0.2)

## 2024-06-05 LAB — COMPREHENSIVE METABOLIC PANEL WITH GFR
ALT: 23 U/L (ref 0–44)
AST: 20 U/L (ref 15–41)
Albumin: 4.2 g/dL (ref 3.5–5.0)
Alkaline Phosphatase: 73 U/L (ref 38–126)
Anion gap: 13 (ref 5–15)
BUN: 27 mg/dL — ABNORMAL HIGH (ref 8–23)
CO2: 27 mmol/L (ref 22–32)
Calcium: 10.5 mg/dL — ABNORMAL HIGH (ref 8.9–10.3)
Chloride: 95 mmol/L — ABNORMAL LOW (ref 98–111)
Creatinine, Ser: 1.33 mg/dL — ABNORMAL HIGH (ref 0.61–1.24)
GFR, Estimated: 57 mL/min — ABNORMAL LOW (ref >60)
Glucose, Bld: 73 mg/dL (ref 70–99)
Potassium: 4 mmol/L (ref 3.5–5.1)
Sodium: 135 mmol/L (ref 135–145)
Total Bilirubin: 1.4 mg/dL — ABNORMAL HIGH (ref 0.0–1.2)
Total Protein: 8.3 g/dL — ABNORMAL HIGH (ref 6.5–8.1)

## 2024-06-05 LAB — LIPASE, BLOOD: Lipase: 19 U/L (ref 11–51)

## 2024-06-05 NOTE — ED Triage Notes (Signed)
 Pt arrived via POV c/o generalized abdominal pain for a few weeks. Pt reports he has emesis after eating but reports normal BM's. Pt reports last BM was this morning.

## 2024-06-09 DIAGNOSIS — Z299 Encounter for prophylactic measures, unspecified: Secondary | ICD-10-CM | POA: Diagnosis not present

## 2024-06-09 DIAGNOSIS — I10 Essential (primary) hypertension: Secondary | ICD-10-CM | POA: Diagnosis not present

## 2024-06-09 DIAGNOSIS — R109 Unspecified abdominal pain: Secondary | ICD-10-CM | POA: Diagnosis not present

## 2024-06-09 DIAGNOSIS — R110 Nausea: Secondary | ICD-10-CM | POA: Diagnosis not present

## 2024-06-11 ENCOUNTER — Encounter: Payer: Self-pay | Admitting: *Deleted

## 2024-06-11 ENCOUNTER — Ambulatory Visit (INDEPENDENT_AMBULATORY_CARE_PROVIDER_SITE_OTHER): Admitting: Gastroenterology

## 2024-06-11 ENCOUNTER — Encounter (INDEPENDENT_AMBULATORY_CARE_PROVIDER_SITE_OTHER): Payer: Self-pay | Admitting: Gastroenterology

## 2024-06-11 VITALS — BP 146/74 | HR 90 | Temp 97.6°F | Ht 71.0 in | Wt 213.3 lb

## 2024-06-11 DIAGNOSIS — R1310 Dysphagia, unspecified: Secondary | ICD-10-CM

## 2024-06-11 DIAGNOSIS — R1319 Other dysphagia: Secondary | ICD-10-CM

## 2024-06-11 DIAGNOSIS — R1013 Epigastric pain: Secondary | ICD-10-CM | POA: Diagnosis not present

## 2024-06-11 NOTE — Progress Notes (Signed)
 Lennan Malone Faizan Gio Janoski , M.D. Gastroenterology & Hepatology [facility] Gastroenterology [address] [place], [place] [address] Primary Care Physician: Rosamond Leta NOVAK, MD [address] [place] [address]  Chief Complaint:  abdominal pain and dysphagia   History of Present Illness: Marc Haynes is a 73 y.o. male with hypertension and hyperlipidemia who presents for evaluation of abdominal pain and dysphagia  Patient reports that for the past couple weeks he has been having epigastric pain that is mostly improves with food intake.  Patient recently he has noticed solid foods from the middle of his chest.  No history of food impactions. The patient denies having any nausea, vomiting, fever, chills, hematochezia, melena, hematemesis, diarrhea, jaundice, pruritus or weight loss.  Labs from 05/2024 normal liver enzymes, amylase lipase TSH 2.6  Last ZHI:wnwz Last Colonoscopy:2020  - One small polyp in the proximal transverse colon. Biopsied.  Repeat 10 year   FHx: neg for any gastrointestinal/liver disease, no malignancies Surgical open cholecystectomy and appendectomy  Past Medical History: Past Medical History:  Diagnosis Date   Diabetes mellitus without complication (HCC)    Eye abnormality    edema of eye .  has had laser surgeries.    Hypercholesteremia    Hypertension    Sleep apnea    does not use CPAP   Stroke [facility]) 2011    Past Surgical History: Past Surgical History:  Procedure Laterality Date   APPENDECTOMY     BLEPHAROPLASTY Bilateral 08/2021   CHOLECYSTECTOMY     COLONOSCOPY     COLONOSCOPY N/A 01/22/2019   Procedure: COLONOSCOPY;  Surgeon: Golda Claudis PENNER, MD;  [facility];  Service: Endoscopy;  Laterality: N/A;  9:30   EYE SURGERY Left    Vitrectomy, bilateral cataracts   FRACTURE SURGERY Left    lower leg/ankle   HEMORRHOID SURGERY  2007?   PARS PLANA VITRECTOMY Right 12/10/2013   Procedure: PARS PLANA  VITRECTOMY 25 GAUGE WITH ENDOLASER RIGHT EYE ;  Surgeon: Arley DELENA Ruder, MD;  Location: [facility] OR;  Service: Ophthalmology;  Laterality: Right;   POLYPECTOMY  01/22/2019   Procedure: POLYPECTOMY;  Surgeon: Golda Claudis PENNER, MD;  [facility];  Service: Endoscopy;;  colon    Family History:History reviewed. No pertinent family history.  Social History: Social History   Tobacco Use  Smoking Status Former   Current packs/day: 0.00   Types: Cigarettes   Quit date: 12/11/1987   Years since quitting: 36.5   Passive exposure: Past  Smokeless Tobacco Never   Social History   Substance and Sexual Activity  Alcohol  Use Yes   Comment: occasional   Social History   Substance and Sexual Activity  Drug Use No    Allergies: Allergies  Allergen Reactions   Cortisone Other (See Comments)    Pancreatitis    Invokana [Canagliflozin] Other (See Comments)    Patient does not tolerate (hospitalized)     Medications: Current Outpatient Medications  Medication Sig Dispense Refill   acetaminophen (TYLENOL) 500 MG tablet Take 1,000 mg by mouth every 6 (six) hours as needed (for pain.).     citalopram (CELEXA) 40 MG tablet Take 40 mg by mouth every evening.     clopidogrel (PLAVIX) 75 MG tablet Take 75 mg by mouth every evening.      diltiazem (TIAZAC) 240 MG 24 hr capsule Take 240 mg by mouth every evening.      ezetimibe (ZETIA) 10 MG tablet Take 10 mg by  mouth every evening.      hydrochlorothiazide (MICROZIDE) 12.5 MG capsule Take 12.5 mg by mouth every evening.      Icosapent Ethyl (VASCEPA) 1 g CAPS Take 2 g by mouth 2 (two) times daily.      Insulin Degludec (TRESIBA FLEXTOUCH) 200 UNIT/ML SOPN Inject 120 Units into the skin daily.      losartan (COZAAR) 100 MG tablet Take 100 mg by mouth daily.     metFORMIN (GLUCOPHAGE-XR) 500 MG 24 hr tablet Take 2,000 mg by mouth every evening.     Multiple Vitamin (MULTIVITAMIN WITH MINERALS) TABS tablet Take 1 tablet by mouth every  evening.      NOVOLOG FLEXPEN 100 UNIT/ML FlexPen Inject 8-30 Units into the skin 5 (five) times daily as needed. Sliding Scale Insulin     pantoprazole (PROTONIX) 40 MG tablet Take 40 mg by mouth every evening.     rosuvastatin (CRESTOR) 20 MG tablet Take 20 mg by mouth every evening.      sucralfate (CARAFATE) 1 g tablet      No current facility-administered medications for this visit.    Review of Systems: GENERAL: negative for malaise, night sweats HEENT: No changes in hearing or vision, no nose bleeds or other nasal problems. NECK: Negative for lumps, goiter, pain and significant neck swelling RESPIRATORY: Negative for cough, wheezing CARDIOVASCULAR: Negative for chest pain, leg swelling, palpitations, orthopnea GI: SEE HPI MUSCULOSKELETAL: Negative for joint pain or swelling, back pain, and muscle pain. SKIN: Negative for lesions, rash HEMATOLOGY Negative for prolonged bleeding, bruising easily, and swollen nodes. ENDOCRINE: Negative for cold or heat intolerance, polyuria, polydipsia and goiter. NEURO: negative for tremor, gait imbalance, syncope and seizures. The remainder of the review of systems is noncontributory.   Physical Exam: BP (!) 146/74   Pulse 90   Temp 97.6 F (36.4 C)   Ht 5' 11 (1.803 m)   Wt 213 lb 4.8 oz (96.8 kg)   BMI 29.75 kg/m  GENERAL: The patient is AO x3, in no acute distress. HEENT: Head is normocephalic and atraumatic. EOMI are intact. Mouth is well hydrated and without lesions. NECK: Supple. No masses LUNGS: Clear to auscultation. No presence of rhonchi/wheezing/rales. Adequate chest expansion HEART: RRR, normal s1 and s2. ABDOMEN: Soft, nontender, no guarding, no peritoneal signs, and nondistended. BS +. No masses.  Imaging/Labs: as above     Latest Ref Rng & Units 06/05/2024    5:17 PM 12/10/2013   11:46 AM 07/30/2009    2:00 PM  CBC  WBC 4.0 - 10.5 K/uL 9.3  8.5  7.7   Hemoglobin 13.0 - 17.0 g/dL 81.8  83.6  88.0   Hematocrit 39.0 -  52.0 % 55.7  48.0  33.9   Platelets 150 - 400 K/uL 212  142  250    No results found for: IRON, TIBC, FERRITIN  I personally reviewed and interpreted the available labs, imaging and endoscopic files.  Impression and Plan:  Marc Haynes is a 73 y.o. male with hypertension and hyperlipidemia who presents for evaluation of abdominal pain and dysphagia  #Epigastric pain  #Dysphagia  Patient has epigastric pain relieved with food intake could be peptic ulcer disease or dyspepsia  Dysphagia  could be from esophageal web ring or stricture  Will obtain complete abdominal ultrasound  Upper endoscopy with dilation  I thoroughly discussed with the patient the procedure, including the risks involved. Patient understands what the procedure involves including the benefits and any risks. Patient understands  alternatives to the proposed procedure. Risks including (but not limited to) bleeding, tearing of the lining (perforation), rupture of adjacent organs, problems with heart and lung function, infection, and medication reactions. A small percentage of complications may require surgery, hospitalization, repeat endoscopic procedure, and/or transfusion.  Patient understood and agreed.   HCM: Patient up-to-date on colonoscopy performed 2020 .suggested repeat 10 years  All questions were answered.      Lemar Bakos Faizan Alysen Smylie, MD Gastroenterology and Hepatology [facility] Gastroenterology   This chart has been completed using [facility] Dictation software, and while attempts have been made to ensure accuracy , certain words and phrases may not be transcribed as intended

## 2024-06-11 NOTE — Patient Instructions (Signed)
 It was very nice to meet you today, as dicussed with will plan for the following :  1) Abdominal ultrasound  2) Upper endoscopy

## 2024-06-16 ENCOUNTER — Telehealth: Payer: Self-pay | Admitting: *Deleted

## 2024-06-16 NOTE — Telephone Encounter (Signed)
    06/16/24  Marc Haynes May 24, 1951  What type of surgery is being performed? EGD/DILATION  When is surgery scheduled? TBD  What type of clearance is required (medical or pharmacy to hold medication or both? MEDICATION  Are there any medications that need to be held prior to surgery and how long? PLAVIX X 5 DAYS PRIOR  Name of physician performing surgery?  Dr. Cinderella Rouse Gastroenterology at [facility] Phone: [phone] Fax: [phone]  Anethesia type (none, local, MAC, general)? MAC

## 2024-06-20 ENCOUNTER — Ambulatory Visit (HOSPITAL_COMMUNITY)
Admission: RE | Admit: 2024-06-20 | Discharge: 2024-06-20 | Disposition: A | Discharge status: Home / Self Care | Source: Ambulatory Visit | Attending: Gastroenterology | Admitting: Gastroenterology

## 2024-06-20 DIAGNOSIS — Z9049 Acquired absence of other specified parts of digestive tract: Secondary | ICD-10-CM | POA: Diagnosis not present

## 2024-06-20 DIAGNOSIS — N281 Cyst of kidney, acquired: Secondary | ICD-10-CM | POA: Diagnosis not present

## 2024-06-20 DIAGNOSIS — R1013 Epigastric pain: Secondary | ICD-10-CM | POA: Diagnosis not present

## 2024-06-30 ENCOUNTER — Ambulatory Visit (INDEPENDENT_AMBULATORY_CARE_PROVIDER_SITE_OTHER): Payer: Self-pay | Admitting: Gastroenterology

## 2024-06-30 NOTE — Progress Notes (Signed)
 Hi Mindy . Did we get clearance for  holding plavix for EGD ? ===========  Ultrasound abdomen   IMPRESSION: Bilateral renal cysts which appear simple in nature. No follow-up is recommended.   Increased hepatic echogenicity likely related to fatty infiltration. No focal mass is seen.   Status post cholecystectomy.

## 2024-07-08 NOTE — Telephone Encounter (Signed)
 Faxed to PCP 7/21, 7/25 and 7/29 to [facility] as she said she didn't see the clearance. I have not received anything back. Called Dr. Rosamond office and was transferred to Dora's VM. Left message to check status of clearance.

## 2024-07-10 NOTE — Telephone Encounter (Signed)
 Pt's wife Mercer left vm stating regarding clearance for pt.

## 2024-07-15 ENCOUNTER — Encounter: Payer: Self-pay | Admitting: *Deleted

## 2024-07-15 NOTE — Telephone Encounter (Signed)
 Spoke to pt's spouse and informed her that I spoke with Clayborne at [employer] and the clearance will be faxed over. We will call once we get clearance to get pt scheduled.

## 2024-07-22 DIAGNOSIS — M79672 Pain in left foot: Secondary | ICD-10-CM | POA: Diagnosis not present

## 2024-07-22 DIAGNOSIS — D2372 Other benign neoplasm of skin of left lower limb, including hip: Secondary | ICD-10-CM | POA: Diagnosis not present

## 2024-08-22 ENCOUNTER — Other Ambulatory Visit: Payer: Self-pay

## 2024-08-22 ENCOUNTER — Ambulatory Visit (HOSPITAL_COMMUNITY): Admitting: Certified Registered Nurse Anesthetist

## 2024-08-22 ENCOUNTER — Ambulatory Visit (HOSPITAL_COMMUNITY)
Admission: RE | Admit: 2024-08-22 | Discharge: 2024-08-22 | Disposition: A | Discharge status: Home / Self Care | Attending: Gastroenterology | Admitting: Gastroenterology

## 2024-08-22 ENCOUNTER — Encounter (HOSPITAL_COMMUNITY): Payer: Self-pay | Admitting: Gastroenterology

## 2024-08-22 ENCOUNTER — Ambulatory Visit (HOSPITAL_BASED_OUTPATIENT_CLINIC_OR_DEPARTMENT_OTHER): Admitting: Certified Registered Nurse Anesthetist

## 2024-08-22 ENCOUNTER — Encounter (HOSPITAL_COMMUNITY)
Admission: RE | Disposition: A | Discharge status: Home / Self Care | Payer: Self-pay | Source: Home / Self Care | Attending: Gastroenterology

## 2024-08-22 DIAGNOSIS — K219 Gastro-esophageal reflux disease without esophagitis: Secondary | ICD-10-CM | POA: Diagnosis not present

## 2024-08-22 DIAGNOSIS — G4730 Sleep apnea, unspecified: Secondary | ICD-10-CM | POA: Diagnosis not present

## 2024-08-22 DIAGNOSIS — K449 Diaphragmatic hernia without obstruction or gangrene: Secondary | ICD-10-CM | POA: Diagnosis not present

## 2024-08-22 DIAGNOSIS — E119 Type 2 diabetes mellitus without complications: Secondary | ICD-10-CM | POA: Diagnosis not present

## 2024-08-22 DIAGNOSIS — Z8673 Personal history of transient ischemic attack (TIA), and cerebral infarction without residual deficits: Secondary | ICD-10-CM | POA: Insufficient documentation

## 2024-08-22 DIAGNOSIS — K3189 Other diseases of stomach and duodenum: Secondary | ICD-10-CM

## 2024-08-22 DIAGNOSIS — Z9049 Acquired absence of other specified parts of digestive tract: Secondary | ICD-10-CM | POA: Insufficient documentation

## 2024-08-22 DIAGNOSIS — K2289 Other specified disease of esophagus: Secondary | ICD-10-CM | POA: Diagnosis not present

## 2024-08-22 DIAGNOSIS — R1310 Dysphagia, unspecified: Secondary | ICD-10-CM | POA: Diagnosis not present

## 2024-08-22 DIAGNOSIS — K2970 Gastritis, unspecified, without bleeding: Secondary | ICD-10-CM

## 2024-08-22 DIAGNOSIS — I10 Essential (primary) hypertension: Secondary | ICD-10-CM | POA: Insufficient documentation

## 2024-08-22 DIAGNOSIS — Z794 Long term (current) use of insulin: Secondary | ICD-10-CM | POA: Insufficient documentation

## 2024-08-22 DIAGNOSIS — Z87891 Personal history of nicotine dependence: Secondary | ICD-10-CM | POA: Diagnosis not present

## 2024-08-22 DIAGNOSIS — Z7984 Long term (current) use of oral hypoglycemic drugs: Secondary | ICD-10-CM | POA: Insufficient documentation

## 2024-08-22 DIAGNOSIS — K208 Other esophagitis without bleeding: Secondary | ICD-10-CM | POA: Diagnosis not present

## 2024-08-22 HISTORY — PX: ESOPHAGOGASTRODUODENOSCOPY: SHX5428

## 2024-08-22 HISTORY — PX: ESOPHAGEAL DILATION: SHX303

## 2024-08-22 LAB — GLUCOSE, CAPILLARY: Glucose-Capillary: 85 mg/dL (ref 70–99)

## 2024-08-22 SURGERY — EGD (ESOPHAGOGASTRODUODENOSCOPY)
Anesthesia: General

## 2024-08-22 MED ORDER — PROPOFOL 500 MG/50ML IV EMUL
INTRAVENOUS | Status: DC | PRN
  Administered 2024-08-22: 150 ug/kg/min via INTRAVENOUS
  Administered 2024-08-22: 100 mg via INTRAVENOUS

## 2024-08-22 MED ORDER — LIDOCAINE 2% (20 MG/ML) 5 ML SYRINGE
INTRAMUSCULAR | Status: AC
  Filled 2024-08-22: qty 5

## 2024-08-22 MED ORDER — PANTOPRAZOLE SODIUM 40 MG PO TBEC: 40.0000 mg | DELAYED_RELEASE_TABLET | Freq: Every evening | ORAL | 1 refills | Status: DC

## 2024-08-22 MED ORDER — LACTATED RINGERS IV SOLN: INTRAVENOUS | Status: DC | PRN

## 2024-08-22 MED ORDER — LACTATED RINGERS IV SOLN: INTRAVENOUS | Status: DC

## 2024-08-22 MED ORDER — LIDOCAINE 2% (20 MG/ML) 5 ML SYRINGE
INTRAMUSCULAR | Status: DC | PRN
Start: 2024-08-22 — End: 2024-08-22
  Administered 2024-08-22: 60 mg via INTRAVENOUS

## 2024-08-22 MED ORDER — PROPOFOL 500 MG/50ML IV EMUL
INTRAVENOUS | Status: AC
  Filled 2024-08-22: qty 50

## 2024-08-22 NOTE — Discharge Instructions (Signed)

## 2024-08-22 NOTE — Anesthesia Postprocedure Evaluation (Signed)
 Anesthesia Post Note  Patient: Marc Haynes  Procedure(s) Performed: EGD (ESOPHAGOGASTRODUODENOSCOPY) DILATION, ESOPHAGUS  Patient location during evaluation: Phase II Anesthesia Type: General Level of consciousness: awake Pain management: pain level controlled Vital Signs Assessment: post-procedure vital signs reviewed and stable Respiratory status: spontaneous breathing and respiratory function stable Cardiovascular status: blood pressure returned to baseline and stable Postop Assessment: no headache and no apparent nausea or vomiting Anesthetic complications: no Comments: Late entry   No notable events documented.   Last Vitals:  Vitals:   08/22/24 0751 08/22/24 0756  BP: 131/79 135/84  Pulse: 65 62  Resp: 12 12  Temp: 36.6 C   SpO2: 95% 94%    Last Pain:  Vitals:   08/22/24 0751  TempSrc: Oral  PainSc: 0-No pain                 Marc Haynes

## 2024-08-22 NOTE — H&P (Signed)
 Primary Care Physician:  Rosamond Leta NOVAK, MD Primary Gastroenterologist:  Dr. Cinderella  Pre-Procedure History & Physical: HPI:  Marc Haynes is a 73 y.o. male with hypertension and hyperlipidemia who presents for evaluation of  dysphagia    he has noticed solid foods from the middle of his chest.  No history of food impactions. The patient denies having any nausea, vomiting, fever, chills, hematochezia, melena, hematemesis, diarrhea, jaundice, pruritus or weight loss.   Labs from 05/2024 normal liver enzymes, amylase lipase TSH 2.6   Last ZHI:wnwz Last Colonoscopy:2020   - One small polyp in the proximal transverse colon. Biopsied.   Repeat 10 year     FHx: neg for any gastrointestinal/liver disease, no malignancies Surgical open cholecystectomy and appendectomy    Past Medical History:  Diagnosis Date   Diabetes mellitus without complication (HCC)    Eye abnormality    edema of eye .  has had laser surgeries.    Hypercholesteremia    Hypertension    Sleep apnea    does not use CPAP   Stroke [facility]) 2011    Past Surgical History:  Procedure Laterality Date   APPENDECTOMY     BLEPHAROPLASTY Bilateral 08/2021   CHOLECYSTECTOMY     COLONOSCOPY     COLONOSCOPY N/A 01/22/2019   Procedure: COLONOSCOPY;  Surgeon: Golda Claudis PENNER, MD;  [facility];  Service: Endoscopy;  Laterality: N/A;  9:30   EYE SURGERY Left    Vitrectomy, bilateral cataracts   FRACTURE SURGERY Left    lower leg/ankle   HEMORRHOID SURGERY  2007?   PARS PLANA VITRECTOMY Right 12/10/2013   Procedure: PARS PLANA VITRECTOMY 25 GAUGE WITH ENDOLASER RIGHT EYE ;  Surgeon: Arley DELENA Ruder, MD;  Location: [facility] OR;  Service: Ophthalmology;  Laterality: Right;   POLYPECTOMY  01/22/2019   Procedure: POLYPECTOMY;  Surgeon: Golda Claudis PENNER, MD;  [facility];  Service: Endoscopy;;  colon    Prior to Admission medications   Medication Sig Start Date End Date Taking? Authorizing Provider   acetaminophen (TYLENOL) 500 MG tablet Take 1,000 mg by mouth every 6 (six) hours as needed (for pain.).   Yes [provider]  citalopram (CELEXA) 40 MG tablet Take 40 mg by mouth every evening. 01/16/19  Yes [provider]  diltiazem (TIAZAC) 240 MG 24 hr capsule Take 240 mg by mouth every evening.    Yes [provider]  ezetimibe (ZETIA) 10 MG tablet Take 10 mg by mouth every evening.    Yes [provider]  hydrochlorothiazide (MICROZIDE) 12.5 MG capsule Take 12.5 mg by mouth every evening.    Yes [provider]  Icosapent Ethyl (VASCEPA) 1 g CAPS Take 2 g by mouth 2 (two) times daily.    Yes [provider]  Insulin Degludec (TRESIBA FLEXTOUCH) 200 UNIT/ML SOPN Inject 120 Units into the skin daily.    Yes [provider]  losartan (COZAAR) 100 MG tablet Take 100 mg by mouth daily.   Yes [provider]  metFORMIN (GLUCOPHAGE-XR) 500 MG 24 hr tablet Take 2,000 mg by mouth every evening. 01/16/19  Yes [provider]  Multiple Vitamin (MULTIVITAMIN WITH MINERALS) TABS tablet Take 1 tablet by mouth every evening.    Yes [provider]  NOVOLOG FLEXPEN 100 UNIT/ML FlexPen Inject 8-30 Units into the skin 5 (five) times daily as needed. Sliding Scale Insulin 01/16/19  Yes [provider]  pantoprazole  (PROTONIX ) 40 MG tablet Take 40 mg  by mouth every evening.   Yes [provider]  rosuvastatin (CRESTOR) 20 MG tablet Take 20 mg by mouth every evening.    Yes [provider]  sucralfate (CARAFATE) 1 g tablet  06/11/24  Yes [provider]  clopidogrel (PLAVIX) 75 MG tablet Take 75 mg by mouth every evening.     [provider]    Allergies as of 07/17/2024 - Review Complete 06/05/2024  Allergen Reaction Noted   Cortisone Other (See Comments)    Invokana [canagliflozin] Other (See Comments) 01/17/2019    History reviewed. No pertinent family history.  Social  History   Socioeconomic History   Marital status: Married    Spouse name: Not on file   Number of children: Not on file   Years of education: Not on file   Highest education level: Not on file  Occupational History   Not on file  Tobacco Use   Smoking status: Former    Current packs/day: 0.00    Types: Cigarettes    Quit date: 12/11/1987    Years since quitting: 36.7    Passive exposure: Past   Smokeless tobacco: Never  Vaping Use   Vaping status: Never Used  Substance and Sexual Activity   Alcohol  use: Yes    Comment: occasional   Drug use: No   Sexual activity: Not on file  Other Topics Concern   Not on file  Social History Narrative   Not on file   Social Drivers of Health   Financial Resource Strain: Not on file  Food Insecurity: Not on file  Transportation Needs: Not on file  Physical Activity: Not on file  Stress: Not on file  Social Connections: Not on file  Intimate Partner Violence: Not on file    Review of Systems: See HPI, otherwise negative ROS  Physical Exam: Vital signs in last 24 hours: Temp:  [97.6 F (36.4 C)] 97.6 F (36.4 C) (09/26 0647) Pulse Rate:  [65] 65 (09/26 0647) Resp:  [22] 22 (09/26 0647) BP: (170)/(85) 170/85 (09/26 0647) SpO2:  [97 %] 97 % (09/26 0647) Weight:  [96.2 kg] 96.2 kg (09/26 0647)   General:   Alert,  Well-developed, well-nourished, pleasant and cooperative in NAD Head:  Normocephalic and atraumatic. Eyes:  Sclera clear, no icterus.   Conjunctiva pink. Ears:  Normal auditory acuity. Nose:  No deformity, discharge,  or lesions. Msk:  Symmetrical without gross deformities. Normal posture. Extremities:  Without clubbing or edema. Neurologic:  Alert and  oriented x4;  grossly normal neurologically. Skin:  Intact without significant lesions or rashes. Psych:  Alert and cooperative. Normal mood and affect.  Impression/Plan: Marc Haynes is a 73 y.o. male with hypertension and hyperlipidemia who presents for  evaluation of  dysphagia   Proceed with EGD /dilation/biopsies  The risks of the procedure including infection, bleed, or perforation as well as benefits, limitations, alternatives and imponderables have been reviewed with the patient. Questions have been answered. All parties agreeable.

## 2024-08-22 NOTE — Transfer of Care (Signed)
 Immediate Anesthesia Transfer of Care Note  Patient: Marc Haynes  Procedure(s) Performed: EGD (ESOPHAGOGASTRODUODENOSCOPY) DILATION, ESOPHAGUS  Patient Location: Endoscopy Unit  Anesthesia Type:General  Level of Consciousness: drowsy  Airway & Oxygen  Therapy: Patient Spontanous Breathing  Post-op Assessment: Report given to RN and Post -op Vital signs reviewed and stable  Post vital signs: Reviewed and stable  Last Vitals:  Vitals Value Taken Time  BP 131/79 08/22/24 07:51  Temp 36.6 C 08/22/24 07:51  Pulse 65 08/22/24 07:51  Resp 12 08/22/24 07:51  SpO2 95 % 08/22/24 07:51    Last Pain:  Vitals:   08/22/24 0751  TempSrc: Oral  PainSc: 0-No pain      Patients Stated Pain Goal: 5 (08/22/24 0647)  Complications: No notable events documented.

## 2024-08-22 NOTE — Anesthesia Preprocedure Evaluation (Signed)
 Anesthesia Evaluation  Patient identified by MRN, date of birth, ID band Patient awake    Reviewed: Allergy & Precautions, H&P , NPO status , Patient's Chart, lab work & pertinent test results, reviewed documented beta blocker date and time   Airway Mallampati: II  TM Distance: >3 FB Neck ROM: full    Dental no notable dental hx.    Pulmonary sleep apnea , former smoker   Pulmonary exam normal breath sounds clear to auscultation       Cardiovascular Exercise Tolerance: Good hypertension,  Rhythm:regular Rate:Normal     Neuro/Psych CVA  negative psych ROS   GI/Hepatic negative GI ROS, Neg liver ROS,,,  Endo/Other  diabetes    Renal/GU negative Renal ROS  negative genitourinary   Musculoskeletal   Abdominal   Peds  Hematology negative hematology ROS (+)   Anesthesia Other Findings   Reproductive/Obstetrics negative OB ROS                              Anesthesia Physical Anesthesia Plan  ASA: 2  Anesthesia Plan: General   Post-op Pain Management:    Induction:   PONV Risk Score and Plan: Propofol  infusion  Airway Management Planned:   Additional Equipment:   Intra-op Plan:   Post-operative Plan:   Informed Consent: I have reviewed the patients History and Physical, chart, labs and discussed the procedure including the risks, benefits and alternatives for the proposed anesthesia with the patient or authorized representative who has indicated his/her understanding and acceptance.     Dental Advisory Given  Plan Discussed with: CRNA  Anesthesia Plan Comments:         Anesthesia Quick Evaluation

## 2024-08-22 NOTE — Op Note (Signed)
 [facility] Patient Name: Marc Haynes Procedure Date: 08/22/2024 7:13 AM MRN: [id number] Date of Birth: 1950/12/16 Attending MD: Deatrice Dine , MD, [id number] CSN: [id number] Age: 73 Admit Type: Outpatient Procedure:                Upper GI endoscopy Indications:              Dysphagia Providers:                Deatrice Dine, MD, Leandrew Edelman RN, RN, Jon Loge Referring MD:              Medicines:                Monitored Anesthesia Care Complications:            No immediate complications. Estimated Blood Loss:     Estimated blood loss was minimal. Procedure:                Pre-Anesthesia Assessment:                           - Prior to the procedure, a History and Physical                            was performed, and patient medications and                            allergies were reviewed. The patient's tolerance of                            previous anesthesia was also reviewed. The risks                            and benefits of the procedure and the sedation                            options and risks were discussed with the patient.                            All questions were answered, and informed consent                            was obtained. Prior Anticoagulants: The patient has                            taken Plavix (clopidogrel), last dose was 5 days                            prior to procedure. ASA Grade Assessment: II - A                            patient with mild systemic disease. After reviewing  the risks and benefits, the patient was deemed in                            satisfactory condition to undergo the procedure.                           After obtaining informed consent, the endoscope was                            passed under direct vision. Throughout the                            procedure, the patient's blood pressure, pulse, and                            oxygen   saturations were monitored continuously. The                            [id number] ([id number]) Upper was introduced through                            the mouth, and advanced to the second part of                            duodenum. The upper GI endoscopy was accomplished                            without difficulty. The patient tolerated the                            procedure well. Scope In: 7:41:08 AM Scope Out: 7:47:10 AM Total Procedure Duration: 0 hours 6 minutes 2 seconds  Findings:      No endoscopic abnormality was evident in the esophagus to explain the       patient's complaint of dysphagia. It was decided, however, to proceed       with dilation of the middle third of the esophagus and of the lower       third of the esophagus. A TTS dilator was passed through the scope.       Dilation with a 15-16.5-18 mm balloon dilator was performed to 18 mm.       The dilation site was examined following endoscope reinsertion and       showed mild mucosal disruption.      The Z-line was irregular and was found in the lower third of the       esophagus. Biopsies were taken with a cold forceps for histology.      A 2 cm hiatal hernia was present.      Mildly erythematous mucosa without bleeding was found in the gastric       antrum. Biopsies were taken with a cold forceps for histology.      The duodenal bulb and second portion of the duodenum were normal. Impression:               - No endoscopic esophageal abnormality to explain  patient's dysphagia. Esophagus dilated. Dilated.                           - Z-line irregular, in the lower third of the                            esophagus. Biopsied.                           - 2 cm hiatal hernia.                           - Erythematous mucosa in the antrum. Biopsied.                           - Normal duodenal bulb and second portion of the                            duodenum. Moderate Sedation:      Per  Anesthesia Care      Per Anesthesia Care Recommendation:           - Patient has a contact number available for                            emergencies. The signs and symptoms of potential                            delayed complications were discussed with the                            patient. Return to normal activities tomorrow.                            Written discharge instructions were provided to the                            patient.                           - Resume previous diet.                           - Continue present medications.                           - Await pathology results. Procedure Code(s):        --- Professional ---                           [phone], Esophagogastroduodenoscopy, flexible,                            transoral; with transendoscopic balloon dilation of                            esophagus (less than 30 mm diameter)  56760, 59, Esophagogastroduodenoscopy, flexible,                            transoral; with biopsy, single or multiple Diagnosis Code(s):        --- Professional ---                           R13.10, Dysphagia, unspecified                           K22.89, Other specified disease of esophagus                           K44.9, Diaphragmatic hernia without obstruction or                            gangrene                           K31.89, Other diseases of stomach and duodenum CPT copyright 2022 [employer]. All rights reserved. The codes documented in this report are preliminary and upon coder review may  be revised to meet current compliance requirements. Deatrice Dine, MD Deatrice Dine, MD 08/22/2024 8:04:15 AM This report has been signed electronically. Number of Addenda: 0

## 2024-08-25 ENCOUNTER — Encounter (HOSPITAL_COMMUNITY): Payer: Self-pay | Admitting: Gastroenterology

## 2024-08-25 LAB — SURGICAL PATHOLOGY

## 2024-08-27 ENCOUNTER — Ambulatory Visit (INDEPENDENT_AMBULATORY_CARE_PROVIDER_SITE_OTHER): Payer: Self-pay | Admitting: Gastroenterology

## 2024-08-27 NOTE — Progress Notes (Signed)
 Patient result letter mailed procedure note and pathology result faxed to PCP

## 2024-09-08 ENCOUNTER — Encounter (INDEPENDENT_AMBULATORY_CARE_PROVIDER_SITE_OTHER): Payer: Self-pay | Admitting: Gastroenterology

## 2024-09-19 DIAGNOSIS — Z79899 Other long term (current) drug therapy: Secondary | ICD-10-CM | POA: Diagnosis not present

## 2024-09-19 DIAGNOSIS — Z794 Long term (current) use of insulin: Secondary | ICD-10-CM | POA: Diagnosis not present

## 2024-09-19 DIAGNOSIS — Z888 Allergy status to other drugs, medicaments and biological substances status: Secondary | ICD-10-CM | POA: Diagnosis not present

## 2024-09-19 DIAGNOSIS — Z8673 Personal history of transient ischemic attack (TIA), and cerebral infarction without residual deficits: Secondary | ICD-10-CM | POA: Diagnosis not present

## 2024-09-19 DIAGNOSIS — E785 Hyperlipidemia, unspecified: Secondary | ICD-10-CM | POA: Diagnosis not present

## 2024-09-19 DIAGNOSIS — I10 Essential (primary) hypertension: Secondary | ICD-10-CM | POA: Diagnosis not present

## 2024-09-19 DIAGNOSIS — K760 Fatty (change of) liver, not elsewhere classified: Secondary | ICD-10-CM | POA: Diagnosis not present

## 2024-09-19 DIAGNOSIS — Z7984 Long term (current) use of oral hypoglycemic drugs: Secondary | ICD-10-CM | POA: Diagnosis not present

## 2024-09-19 DIAGNOSIS — E119 Type 2 diabetes mellitus without complications: Secondary | ICD-10-CM | POA: Diagnosis not present

## 2024-09-19 DIAGNOSIS — R519 Headache, unspecified: Secondary | ICD-10-CM | POA: Diagnosis not present

## 2024-09-19 DIAGNOSIS — R0789 Other chest pain: Secondary | ICD-10-CM | POA: Diagnosis not present

## 2024-09-19 NOTE — ED Provider Notes (Addendum)
 1815 hrs. Assumed care at shift change briefly gentleman 73 year old with chest pain short stay rule out in progress most recent echocardiogram stress test in 2016 results not available EKG showed bundle branch block pattern with no acute ST-T wave changes first troponins essentially negative plan outpatient treatment  Serial EKGs are unremarkable recheck blood pressure.  Patient only has right bundle branch block.  Low suspicion for vascular or thromboembolic process Patient also has some nonspecific headache takes Plavix will have a CT imaging blood pressure control  2020 hrs. CT findings show old stroke only but no new acute process

## 2024-09-22 DIAGNOSIS — E1140 Type 2 diabetes mellitus with diabetic neuropathy, unspecified: Secondary | ICD-10-CM | POA: Diagnosis not present

## 2024-09-22 DIAGNOSIS — I10 Essential (primary) hypertension: Secondary | ICD-10-CM | POA: Diagnosis not present

## 2024-09-22 DIAGNOSIS — E7800 Pure hypercholesterolemia, unspecified: Secondary | ICD-10-CM | POA: Diagnosis not present

## 2024-09-22 DIAGNOSIS — Z299 Encounter for prophylactic measures, unspecified: Secondary | ICD-10-CM | POA: Diagnosis not present

## 2024-09-23 ENCOUNTER — Ambulatory Visit (INDEPENDENT_AMBULATORY_CARE_PROVIDER_SITE_OTHER): Admitting: Gastroenterology

## 2024-09-23 ENCOUNTER — Encounter (INDEPENDENT_AMBULATORY_CARE_PROVIDER_SITE_OTHER): Payer: Self-pay | Admitting: Gastroenterology

## 2024-09-23 VITALS — BP 157/74 | HR 67 | Temp 97.1°F | Ht 71.0 in | Wt 213.8 lb

## 2024-09-23 DIAGNOSIS — K219 Gastro-esophageal reflux disease without esophagitis: Secondary | ICD-10-CM | POA: Diagnosis not present

## 2024-09-23 DIAGNOSIS — R1013 Epigastric pain: Secondary | ICD-10-CM

## 2024-09-23 NOTE — Addendum Note (Signed)
 Addended by: CINDERELLA DEATRICE SMILES on: 09/23/2024 12:37 PM   Modules accepted: Level of Service

## 2024-09-23 NOTE — Patient Instructions (Signed)
 It was very nice to meet you today, as dicussed with will plan for the following :  1) CT abdomen and pelvis

## 2024-09-23 NOTE — Progress Notes (Signed)
 Kelsey Durflinger Faizan Teniqua Marron , M.D. Gastroenterology & Hepatology [facility] Gastroenterology [address] [place], [place] [address] Primary Care Physician: Rosamond Leta NOVAK, MD [address] [place] [address]  Chief Complaint:  abdominal pain  History of Present Illness: Marc Haynes is a 73 y.o. male with hypertension and hyperlipidemia who presents for evaluation of abdominal pain and dysphagia  Patient was last seen 05/2024.  He underwent ultrasound demonstrating hepatic steatosis and upper endoscopy with dilation and biopsies positive for GERD. 4 days ago patient had an ER visit for chest pain.  Patient continues to report epigastric discomfort no relationship with food intake but sometimes improved after eating .  Previously complaining of solid food dysphagia which has completely resolved after dilation.  No history of food impactions. The patient denies having any nausea, vomiting, fever, chills, hematochezia, melena, hematemesis, diarrhea, jaundice, pruritus or weight loss.  Labs from 05/2024 normal liver enzymes, amylase lipase TSH 2.6 Labs from 07/06/2024 hemoglobin 16 platelet 175 Lipase 13 Negative troponin normal liver enzymes   Last EGD:07/2024  Impression: - No endoscopic esophageal abnormality to explain patient' s dysphagia. Esophagus dilated. Dilated. - Z- line irregular, in the lower third of the esophagus. Biopsied. - 2 cm hiatal hernia. - Erythematous mucosa in the antrum. Biopsied. - Normal duodenal bulb and second portion of the duodenum.  A. STOMACH BIOPSY:  Gastric antral mucosa with changes of reactive gastropathy.  Negative for intestinal metaplasia, dysplasia and malignancy.  Negative for Helicobacter pylori.   B. ESOPHAGEAL BIOPSY:  Gastroesophageal mucosa with mild inflammation consistent with reflux.  Negative for intestinal metaplasia and dysplasia.   Last Colonoscopy:2020  - One small polyp in the proximal transverse colon.  Biopsied.  Repeat 10 year  FHx: neg for any gastrointestinal/liver disease, no malignancies Surgical open cholecystectomy and appendectomy  Past Medical History: Past Medical History:  Diagnosis Date   Diabetes mellitus without complication (HCC)    Eye abnormality    edema of eye .  has had laser surgeries.    Hypercholesteremia    Hypertension    Sleep apnea    does not use CPAP   Stroke [facility]) 2011    Past Surgical History: Past Surgical History:  Procedure Laterality Date   APPENDECTOMY     BLEPHAROPLASTY Bilateral 08/2021   CHOLECYSTECTOMY     COLONOSCOPY     COLONOSCOPY N/A 01/22/2019   Procedure: COLONOSCOPY;  Surgeon: Golda Claudis PENNER, MD;  [facility];  Service: Endoscopy;  Laterality: N/A;  9:30   ESOPHAGEAL DILATION N/A 08/22/2024   Procedure: DILATION, ESOPHAGUS;  Surgeon: Cinderella Deatrice FALCON, MD;  [facility]: [facility];  Service: Endoscopy;  Laterality: N/A;   ESOPHAGOGASTRODUODENOSCOPY N/A 08/22/2024   Procedure: EGD (ESOPHAGOGASTRODUODENOSCOPY);  Surgeon: Cinderella Deatrice FALCON, MD;  [facility]: [facility];  Service: Endoscopy;  Laterality: N/A;  11:45am, asa 1-2   EYE SURGERY Left    Vitrectomy, bilateral cataracts   FRACTURE SURGERY Left    lower leg/ankle   HEMORRHOID SURGERY  2007?   PARS PLANA VITRECTOMY Right 12/10/2013   Procedure: PARS PLANA VITRECTOMY 25 GAUGE WITH ENDOLASER RIGHT EYE ;  Surgeon: Arley DELENA Ruder, MD;  Location: [facility] OR;  Service: Ophthalmology;  Laterality: Right;   POLYPECTOMY  01/22/2019   Procedure: POLYPECTOMY;  Surgeon: Golda Claudis PENNER, MD;  [facility]: [facility];  Service: Endoscopy;;  colon    Family History:History reviewed. No pertinent family history.  Social History: Social History   Tobacco Use  Smoking Status Former   Current packs/day: 0.00   Types: Cigarettes   Quit date: 12/11/1987   Years since quitting: 36.8   Passive exposure: Past  Smokeless Tobacco Never   Social History   Substance and Sexual  Activity  Alcohol  Use Yes   Comment: occasional   Social History   Substance and Sexual Activity  Drug Use No    Allergies: Allergies  Allergen Reactions   Cortisone Other (See Comments)    Pancreatitis    Invokana [Canagliflozin] Other (See Comments)    Patient does not tolerate (hospitalized)     Medications: Current Outpatient Medications  Medication Sig Dispense Refill   acetaminophen (TYLENOL) 500 MG tablet Take 1,000 mg by mouth every 6 (six) hours as needed (for pain.).     candesartan (ATACAND) 32 MG tablet Take 32 mg by mouth daily.     citalopram (CELEXA) 40 MG tablet Take 40 mg by mouth every evening.     clopidogrel (PLAVIX) 75 MG tablet Take 75 mg by mouth every evening.      ezetimibe (ZETIA) 10 MG tablet Take 10 mg by mouth every evening.      hydrochlorothiazide (MICROZIDE) 12.5 MG capsule Take 12.5 mg by mouth every evening.      Icosapent Ethyl (VASCEPA) 1 g CAPS Take 2 g by mouth 2 (two) times daily.      Insulin Degludec (TRESIBA FLEXTOUCH) 200 UNIT/ML SOPN Inject 120 Units into the skin daily.      losartan (COZAAR) 100 MG tablet Take 100 mg by mouth daily.     metFORMIN (GLUCOPHAGE-XR) 500 MG 24 hr tablet Take 2,000 mg by mouth every evening.     Multiple Vitamin (MULTIVITAMIN WITH MINERALS) TABS tablet Take 1 tablet by mouth every evening.      NOVOLOG FLEXPEN 100 UNIT/ML FlexPen Inject 8-30 Units into the skin 5 (five) times daily as needed. Sliding Scale Insulin     pantoprazole  (PROTONIX ) 40 MG tablet Take 1 tablet (40 mg total) by mouth every evening. 30 tablet 1   rosuvastatin (CRESTOR) 20 MG tablet Take 20 mg by mouth every evening.      diltiazem (TIAZAC) 240 MG 24 hr capsule Take 240 mg by mouth every evening.  (Patient not taking: Reported on 09/23/2024)     No current facility-administered medications for this visit.    Review of Systems: GENERAL: negative for malaise, night sweats HEENT: No changes in hearing or vision, no nose bleeds or  other nasal problems. NECK: Negative for lumps, goiter, pain and significant neck swelling RESPIRATORY: Negative for cough, wheezing CARDIOVASCULAR: Negative for chest pain, leg swelling, palpitations, orthopnea GI: SEE HPI MUSCULOSKELETAL: Negative for joint pain or swelling, back pain, and muscle pain. SKIN: Negative for lesions, rash HEMATOLOGY Negative for prolonged bleeding, bruising easily, and swollen nodes. ENDOCRINE: Negative for cold or heat intolerance, polyuria, polydipsia and goiter. NEURO: negative for tremor, gait imbalance, syncope and seizures. The remainder of the review of systems is noncontributory.   Physical Exam: BP (!) 157/74   Pulse 67   Temp (!) 97.1 F (36.2 C)   Ht 5' 11 (1.803 m)   Wt 213 lb 12.8 oz (97 kg)   BMI 29.82 kg/m  GENERAL: The patient is AO x3, in no acute distress. HEENT: Head is normocephalic and atraumatic. EOMI are intact. Mouth is well hydrated and without lesions. NECK: Supple. No masses LUNGS: Clear to auscultation. No presence of rhonchi/wheezing/rales. Adequate chest expansion HEART: RRR, normal s1 and s2.  ABDOMEN: Soft, epigastric tenderness, no guarding, no peritoneal signs, and nondistended. BS +. No masses.  Imaging/Labs: as above     Latest Ref Rng & Units 06/05/2024    5:17 PM 12/10/2013   11:46 AM 07/30/2009    2:00 PM  CBC  WBC 4.0 - 10.5 K/uL 9.3  8.5  7.7   Hemoglobin 13.0 - 17.0 g/dL 81.8  83.6  88.0   Hematocrit 39.0 - 52.0 % 55.7  48.0  33.9   Platelets 150 - 400 K/uL 212  142  250    No results found for: IRON, TIBC, FERRITIN  I personally reviewed and interpreted the available labs, imaging and endoscopic files.  Ultrasound abdomen   IMPRESSION: Bilateral renal cysts which appear simple in nature. No follow-up is recommended.   Increased hepatic echogenicity likely related to fatty infiltration. No focal mass is seen.   Status post cholecystectomy.  Impression and Plan:  Marc Haynes is  a 73 y.o. male with hypertension and hyperlipidemia who presents for evaluation of abdominal pain   #Epigastric pain  #Dysphagia-resolved  Dysphagia has resolved after dilation, biopsies positive for reflux  Patient has persistent epigastric pain not explained by recent upper endoscopy as it is negative for H. pylori gastritis or peptic ulcer disease.  Abdominal ultrasound was negative to explain abdominal pain  Given persistent pain, and exam positive for epigastric tenderness will obtain cross-sectional imaging with CT abdomen pelvis IV contrast  Continue PPI for GERD  HCM: Patient up-to-date on colonoscopy performed 2020 .suggested repeat 10 years  All questions were answered.      Marc Allen Faizan Jermell Holeman, MD Gastroenterology and Hepatology [facility] Gastroenterology   This chart has been completed using [facility] Dictation software, and while attempts have been made to ensure accuracy , certain words and phrases may not be transcribed as intended

## 2024-09-24 ENCOUNTER — Telehealth: Payer: Self-pay | Admitting: *Deleted

## 2024-09-24 NOTE — Telephone Encounter (Signed)
 Called pt, spoke with Vivian (spouse, on dpr) and she aware of CT appt details. She voiced understanding.

## 2024-09-30 DIAGNOSIS — E11621 Type 2 diabetes mellitus with foot ulcer: Secondary | ICD-10-CM | POA: Diagnosis not present

## 2024-09-30 DIAGNOSIS — E7800 Pure hypercholesterolemia, unspecified: Secondary | ICD-10-CM | POA: Diagnosis not present

## 2024-09-30 DIAGNOSIS — Z1339 Encounter for screening examination for other mental health and behavioral disorders: Secondary | ICD-10-CM | POA: Diagnosis not present

## 2024-09-30 DIAGNOSIS — Z0000 Encounter for general adult medical examination without abnormal findings: Secondary | ICD-10-CM | POA: Diagnosis not present

## 2024-09-30 DIAGNOSIS — R5383 Other fatigue: Secondary | ICD-10-CM | POA: Diagnosis not present

## 2024-09-30 DIAGNOSIS — Z79899 Other long term (current) drug therapy: Secondary | ICD-10-CM | POA: Diagnosis not present

## 2024-09-30 DIAGNOSIS — I10 Essential (primary) hypertension: Secondary | ICD-10-CM | POA: Diagnosis not present

## 2024-09-30 DIAGNOSIS — Z125 Encounter for screening for malignant neoplasm of prostate: Secondary | ICD-10-CM | POA: Diagnosis not present

## 2024-09-30 DIAGNOSIS — Z7189 Other specified counseling: Secondary | ICD-10-CM | POA: Diagnosis not present

## 2024-09-30 DIAGNOSIS — Z1331 Encounter for screening for depression: Secondary | ICD-10-CM | POA: Diagnosis not present

## 2024-09-30 DIAGNOSIS — Z299 Encounter for prophylactic measures, unspecified: Secondary | ICD-10-CM | POA: Diagnosis not present

## 2024-09-30 DIAGNOSIS — L97509 Non-pressure chronic ulcer of other part of unspecified foot with unspecified severity: Secondary | ICD-10-CM | POA: Diagnosis not present

## 2024-09-30 DIAGNOSIS — Z23 Encounter for immunization: Secondary | ICD-10-CM | POA: Diagnosis not present

## 2024-10-03 ENCOUNTER — Ambulatory Visit (HOSPITAL_COMMUNITY)
Admission: RE | Admit: 2024-10-03 | Discharge: 2024-10-03 | Disposition: A | Discharge status: Home / Self Care | Source: Ambulatory Visit | Attending: Gastroenterology | Admitting: Gastroenterology

## 2024-10-03 DIAGNOSIS — R1013 Epigastric pain: Secondary | ICD-10-CM | POA: Diagnosis not present

## 2024-10-03 DIAGNOSIS — K769 Liver disease, unspecified: Secondary | ICD-10-CM | POA: Diagnosis not present

## 2024-10-03 LAB — POCT I-STAT CREATININE: Creatinine, Ser: 1 mg/dL (ref 0.61–1.24)

## 2024-10-03 MED ORDER — IOHEXOL 300 MG/ML  SOLN
100.0000 mL | Freq: Once | INTRAMUSCULAR | Status: AC | PRN
  Administered 2024-10-03: 100 mL via INTRAVENOUS

## 2024-10-08 DIAGNOSIS — H04123 Dry eye syndrome of bilateral lacrimal glands: Secondary | ICD-10-CM | POA: Diagnosis not present

## 2024-10-08 DIAGNOSIS — E113393 Type 2 diabetes mellitus with moderate nonproliferative diabetic retinopathy without macular edema, bilateral: Secondary | ICD-10-CM | POA: Diagnosis not present

## 2024-10-08 DIAGNOSIS — H31003 Unspecified chorioretinal scars, bilateral: Secondary | ICD-10-CM | POA: Diagnosis not present

## 2024-10-08 DIAGNOSIS — H52203 Unspecified astigmatism, bilateral: Secondary | ICD-10-CM | POA: Diagnosis not present

## 2024-10-09 ENCOUNTER — Ambulatory Visit (INDEPENDENT_AMBULATORY_CARE_PROVIDER_SITE_OTHER): Payer: Self-pay | Admitting: Gastroenterology

## 2024-10-09 DIAGNOSIS — K769 Liver disease, unspecified: Secondary | ICD-10-CM

## 2024-10-09 NOTE — Progress Notes (Signed)
 Hi Mindy/Tammy,   Can you please schedule a MRI ABDOMEN  ? Dx: Liver lesion   Thanks,  Brina Umeda Faizan Fredericka Bottcher, MD Gastroenterology and Hepatology [facility] Gastroenterology   =============== Hi Glenys ,  Can you please call the patient and tell the patient theCT shows few things  1) Small liver lesion and radiologist recommended MRI to further investigate this , if he does not have any metal and is agreeble we can schedule this  2) Large prostate for which I would recommend following up with your PCP and urology   3) Pancreas is smaller and reports to be same as before , this can also be looked closely with MRI   Thanks,  Raffaele Derise Faizan Cecilia Nishikawa, MD Gastroenterology and Hepatology [facility] Gastroenterology  =================   CT ABDOMEN AND PELVIS  IMPRESSION: 1. New 1.5 x 1.1 cm low-density lesion (HU 32) in the anterior aspect of liver segment 5, indeterminate; recommend liver-dedicated MRI for further evaluation. 2. Markedly advanced pancreatic atrophy, particularly in the body and tail sections. This is chronic and was seen previously. 3. Partially rim-calcified 2.4 x 1.7 cm hypodense mass just above the pancreatic body, unchanged and most consistent with an old pseudocyst. 4. Prostatomegaly. 5. Progressive aortic atherosclerosis. 6. No acute CT findings throughout the abdomen and pelvis. Remaining findings described above .

## 2024-10-11 ENCOUNTER — Ambulatory Visit (HOSPITAL_COMMUNITY)
Admission: RE | Admit: 2024-10-11 | Discharge: 2024-10-11 | Disposition: A | Discharge status: Home / Self Care | Source: Ambulatory Visit | Attending: Gastroenterology | Admitting: Gastroenterology

## 2024-10-11 DIAGNOSIS — R932 Abnormal findings on diagnostic imaging of liver and biliary tract: Secondary | ICD-10-CM | POA: Diagnosis not present

## 2024-10-11 DIAGNOSIS — K769 Liver disease, unspecified: Secondary | ICD-10-CM | POA: Insufficient documentation

## 2024-10-11 DIAGNOSIS — K8689 Other specified diseases of pancreas: Secondary | ICD-10-CM | POA: Diagnosis not present

## 2024-10-11 DIAGNOSIS — N281 Cyst of kidney, acquired: Secondary | ICD-10-CM | POA: Diagnosis not present

## 2024-10-11 MED ORDER — GADOBUTROL 1 MMOL/ML IV SOLN
10.0000 mL | Freq: Once | INTRAVENOUS | Status: AC | PRN
  Administered 2024-10-11: 10 mL via INTRAVENOUS

## 2024-10-13 ENCOUNTER — Ambulatory Visit (INDEPENDENT_AMBULATORY_CARE_PROVIDER_SITE_OTHER): Payer: Self-pay | Admitting: Gastroenterology

## 2024-10-13 NOTE — Progress Notes (Signed)
 Hi Tanya ,  Can you please call the patient and tell the patient the MRI shows a small lesion and because of its small size and movement during the MRI it could not be completely characterized.  Given this recommend repeat CT abdomen pelvis in 6 months  Thanks,  Marvella Jenning Faizan Bita Cartwright, MD Gastroenterology and Hepatology [facility] Gastroenterology ==========================  IMPRESSION: Small area of decreased contrast enhancement in the anterior right hepatic lobe cannot be fully characterized due to its small size and motion artifact. Recommend continued follow-up by abdomen CT without and with contrast in 6 months.  Will obtain AFP as well

## 2024-10-14 NOTE — Progress Notes (Signed)
6 mth CT noted in recall 

## 2024-11-12 ENCOUNTER — Emergency Department (HOSPITAL_COMMUNITY)

## 2024-11-12 ENCOUNTER — Encounter (HOSPITAL_COMMUNITY): Payer: Self-pay

## 2024-11-12 ENCOUNTER — Other Ambulatory Visit: Payer: Self-pay

## 2024-11-12 ENCOUNTER — Emergency Department (HOSPITAL_COMMUNITY)
Admission: EM | Admit: 2024-11-12 | Discharge: 2024-11-12 | Disposition: A | Discharge status: Home / Self Care | Source: Home / Self Care | Attending: Emergency Medicine | Admitting: Emergency Medicine

## 2024-11-12 DIAGNOSIS — Z7984 Long term (current) use of oral hypoglycemic drugs: Secondary | ICD-10-CM | POA: Diagnosis not present

## 2024-11-12 DIAGNOSIS — R079 Chest pain, unspecified: Secondary | ICD-10-CM | POA: Insufficient documentation

## 2024-11-12 DIAGNOSIS — Z79899 Other long term (current) drug therapy: Secondary | ICD-10-CM | POA: Insufficient documentation

## 2024-11-12 DIAGNOSIS — R1011 Right upper quadrant pain: Secondary | ICD-10-CM | POA: Diagnosis not present

## 2024-11-12 DIAGNOSIS — I10 Essential (primary) hypertension: Secondary | ICD-10-CM | POA: Insufficient documentation

## 2024-11-12 DIAGNOSIS — E119 Type 2 diabetes mellitus without complications: Secondary | ICD-10-CM | POA: Diagnosis not present

## 2024-11-12 LAB — CBC WITH DIFFERENTIAL/PLATELET
Abs Immature Granulocytes: 0.06 K/uL (ref 0.00–0.07)
Basophils Absolute: 0 K/uL (ref 0.0–0.1)
Basophils Relative: 1 %
Eosinophils Absolute: 0.1 K/uL (ref 0.0–0.5)
Eosinophils Relative: 1 %
HCT: 52.7 % — ABNORMAL HIGH (ref 39.0–52.0)
Hemoglobin: 17.7 g/dL — ABNORMAL HIGH (ref 13.0–17.0)
Immature Granulocytes: 1 %
Lymphocytes Relative: 24 %
Lymphs Abs: 2.1 K/uL (ref 0.7–4.0)
MCH: 30.3 pg (ref 26.0–34.0)
MCHC: 33.6 g/dL (ref 30.0–36.0)
MCV: 90.1 fL (ref 80.0–100.0)
Monocytes Absolute: 1 K/uL (ref 0.1–1.0)
Monocytes Relative: 11 %
Neutro Abs: 5.5 K/uL (ref 1.7–7.7)
Neutrophils Relative %: 62 %
Platelets: 219 K/uL (ref 150–400)
RBC: 5.85 MIL/uL — ABNORMAL HIGH (ref 4.22–5.81)
RDW: 14.6 % (ref 11.5–15.5)
WBC: 8.7 K/uL (ref 4.0–10.5)
nRBC: 0 % (ref 0.0–0.2)

## 2024-11-12 LAB — COMPREHENSIVE METABOLIC PANEL WITH GFR
ALT: 42 U/L (ref 0–44)
AST: 28 U/L (ref 15–41)
Albumin: 4.3 g/dL (ref 3.5–5.0)
Alkaline Phosphatase: 91 U/L (ref 38–126)
Anion gap: 16 — ABNORMAL HIGH (ref 5–15)
BUN: 26 mg/dL — ABNORMAL HIGH (ref 8–23)
CO2: 22 mmol/L (ref 22–32)
Calcium: 10 mg/dL (ref 8.9–10.3)
Chloride: 100 mmol/L (ref 98–111)
Creatinine, Ser: 1.02 mg/dL (ref 0.61–1.24)
GFR, Estimated: >60 mL/min (ref >60)
Glucose, Bld: 71 mg/dL (ref 70–99)
Potassium: 3.7 mmol/L (ref 3.5–5.1)
Sodium: 139 mmol/L (ref 135–145)
Total Bilirubin: 1 mg/dL (ref 0.0–1.2)
Total Protein: 7.4 g/dL (ref 6.5–8.1)

## 2024-11-12 LAB — TROPONIN T, HIGH SENSITIVITY
Troponin T High Sensitivity: 19 ng/L (ref 0–19)
Troponin T High Sensitivity: 18 ng/L (ref 0–19)

## 2024-11-12 LAB — CBG MONITORING, ED
Glucose-Capillary: 60 mg/dL — ABNORMAL LOW (ref 70–99)
Glucose-Capillary: 161 mg/dL — ABNORMAL HIGH (ref 70–99)

## 2024-11-12 LAB — LIPASE, BLOOD: Lipase: <10 U/L — ABNORMAL LOW (ref 11–51)

## 2024-11-12 MED ORDER — IOHEXOL 350 MG/ML SOLN
80.0000 mL | Freq: Once | INTRAVENOUS | Status: AC | PRN
  Administered 2024-11-12: 20:00:00 80 mL via INTRAVENOUS

## 2024-11-12 MED ORDER — ONDANSETRON HCL 4 MG/2ML IJ SOLN
4.0000 mg | Freq: Once | INTRAMUSCULAR | Status: AC
  Administered 2024-11-12: 20:00:00 4 mg via INTRAVENOUS
  Filled 2024-11-12: qty 2

## 2024-11-12 MED ORDER — LACTATED RINGERS IV BOLUS
1000.0000 mL | Freq: Once | INTRAVENOUS | Status: AC
  Administered 2024-11-12: 19:00:00 1000 mL via INTRAVENOUS

## 2024-11-12 NOTE — Discharge Instructions (Signed)
 Your testing today did not show any evidence of a heart attack.  We are referring you to a cardiologist based on your chest pain and risk factors.  We are also prescribing a medicine called Carafate which could help with your stomach if this is the cause of your pain.    If you develop any new or worsening symptoms then return to the ER.

## 2024-11-12 NOTE — ED Provider Notes (Signed)
 [place] EM[facility]ENC[facility] DEPARTMENT AT [facility] Provider Note   CSN: [id number] Arrival date & time: 11/12/24  8251     Patient presents with: Chest Pain   Marc Haynes is a 73 y.o. male.   HPI 73 year old male presents with chest pain.  He was sent in by his doctor with a note that indicated he has been having chest pain with a lot of cardiac risk factors.  Patient and his wife note he is been having this chest pain for many months.  This is a daily occurrence where he has continuous chest heaviness and vomits first thing in the morning.  He does have a history of pancreatitis and this feels somewhat similar.  He has been to an outside ER multiple times.  He had an EGD and has had a CT of his chest according to family though I cannot see these results.  The patient states he is short of breath and the chest pain is continuous and does not get worse with exertion.  He is currently having some right upper quadrant abdominal pain and has had a previous cholecystectomy.  He was feeling somewhat worse today which prompted his PCP visit but otherwise these are very similar to the symptoms he has been dealing with for months.  Has a history of prior smoking, Hypertension, diabetes, hyperlipidemia and family history of CAD.  Prior to Admission medications  Medication Sig Start Date End Date Taking? Authorizing Provider  sucralfate (CARAFATE) 1 g tablet Take 1 tablet (1 g total) by mouth 4 (four) times daily -  with meals and at bedtime. 11/12/24 12/12/24 Yes Freddi Hamilton, MD  acetaminophen (TYLENOL) 500 MG tablet Take 1,000 mg by mouth every 6 (six) hours as needed (for pain.).    [provider]  candesartan (ATACAND) 32 MG tablet Take 32 mg by mouth daily.    [provider]  citalopram (CELEXA) 40 MG tablet Take 40 mg by mouth every evening. 01/16/19   [provider]  clopidogrel (PLAVIX) 75 MG tablet Take 75 mg by mouth every evening.     [provider]  diltiazem (TIAZAC) 240 MG 24 hr capsule Take 240 mg by mouth every evening.  Patient not taking: Reported on 09/23/2024    [provider]  ezetimibe (ZETIA) 10 MG tablet Take 10 mg by mouth every evening.     [provider]  hydrochlorothiazide (MICROZIDE) 12.5 MG capsule Take 12.5 mg by mouth every evening.     [provider]  Icosapent Ethyl (VASCEPA) 1 g CAPS Take 2 g by mouth 2 (two) times daily.     [provider]  Insulin Degludec (TRESIBA FLEXTOUCH) 200 UNIT/ML SOPN Inject 120 Units into the skin daily.     [provider]  losartan (COZAAR) 100 MG tablet Take 100 mg by mouth daily.    [provider]  metFORMIN (GLUCOPHAGE-XR) 500 MG 24 hr tablet Take 2,000 mg by mouth every evening. 01/16/19   [provider]  Multiple Vitamin (MULTIVITAMIN WITH MINERALS) TABS tablet Take 1 tablet by mouth every evening.     [provider]  NOVOLOG FLEXPEN 100 UNIT/ML FlexPen Inject 8-30 Units into the skin 5 (five) times daily as needed. Sliding Scale Insulin 01/16/19   [provider]  pantoprazole  (PROTONIX ) 40 MG tablet Take 1 tablet (40 mg total) by mouth every evening. 08/22/24 10/21/24  Ahmed, Deatrice FALCON, MD  rosuvastatin (CRESTOR) 20 MG tablet Take 20 mg by  mouth every evening.     [provider]    Allergies: Cortisone and Invokana [canagliflozin]    Review of Systems  Respiratory:  Positive for shortness of breath.   Cardiovascular:  Positive for chest pain.  Gastrointestinal:  Positive for abdominal pain and vomiting.    Updated Vital Signs BP (!) 161/98   Pulse 87   Temp 98.2 F (36.8 C)   Resp (!) 21   Ht 5' 11 (1.803 m)   Wt 97.1 kg   SpO2 90%   BMI 29.85 kg/m   Physical Exam Vitals and nursing note reviewed.  Constitutional:      Appearance: He is well-developed.  HENT:     Head: Normocephalic and atraumatic.  Cardiovascular:     Rate and Rhythm: Normal rate  and regular rhythm.     Heart sounds: Normal heart sounds.  Pulmonary:     Effort: Pulmonary effort is normal.     Breath sounds: Normal breath sounds.  Abdominal:     Palpations: Abdomen is soft.     Tenderness: There is abdominal tenderness (mild) in the right upper quadrant.  Skin:    General: Skin is warm and dry.  Neurological:     Mental Status: He is alert.     (all labs ordered are listed, but only abnormal results are displayed) Labs Reviewed  CBC WITH DIFFERENTIAL/PLATELET - Abnormal; Notable for the following components:      Result Value   RBC 5.85 (*)    Hemoglobin 17.7 (*)    HCT 52.7 (*)    All other components within normal limits  COMPREHENSIVE METABOLIC PANEL WITH GFR - Abnormal; Notable for the following components:   BUN 26 (*)    Anion gap 16 (*)    All other components within normal limits  LIPASE, BLOOD - Abnormal; Notable for the following components:   Lipase <10 (*)    All other components within normal limits  CBG MONITORING, ED - Abnormal; Notable for the following components:   Glucose-Capillary 60 (*)    All other components within normal limits  CBG MONITORING, ED - Abnormal; Notable for the following components:   Glucose-Capillary 161 (*)    All other components within normal limits  TROPONIN T, HIGH SENSITIVITY  TROPONIN T, HIGH SENSITIVITY    EKG: EKG Interpretation Date/Time:  Wednesday November 12 2024 17:54:56 EST Ventricular Rate:  102 PR Interval:  248 QRS Duration:  130 QT Interval:  352 QTC Calculation: 458 R Axis:   142  Text Interpretation: Sinus tachycardia with 1st degree A-V block with Premature atrial complexes Right bundle branch block T wave abnormality, consider inferior ischemia Abnormal ECG No previous ECGs available Confirmed by Freddi Hamilton [phone]) on 11/12/2024 5:59:25 PM  Radiology: CT ABDOMEN PELVIS W CONTRAST Result Date: 11/12/2024 EXAM: CT ABDOMEN AND PELVIS WITH CONTRAST 11/12/2024 08:30:26 PM  TECHNIQUE: CT of the abdomen and pelvis was performed with the administration of 80 mL Omnipaque  350 MG/ML injection. Multiplanar reformatted images are provided for review. Automated exposure control, iterative reconstruction, and/or weight-based adjustment of the mA/kV was utilized to reduce the radiation dose to as low as reasonably achievable. COMPARISON: Comparison from 10/11/2024 MRI and 10/03/2024 CT. CLINICAL HISTORY: Upper abdominal pain, possible pancreatitis. FINDINGS: LOWER CHEST: No acute abnormality. LIVER: Area of decreased attenuation is again identified in the anterior aspect of the right lobe of the liver, segment 5. This is stable in appearance from the prior exam. GALLBLADDER AND BILE DUCTS: The gallbladder  has been surgically removed. No biliary ductal dilatation. SPLEEN: No acute abnormality. PANCREAS: A partially calcified cystic lesion is noted in the mid portion of the pancreas, stable in appearance from the prior exam. No findings to suggest pancreatitis are noted. ADRENAL GLANDS: No acute abnormality. KIDNEYS, URETERS AND BLADDER: Cystic changes are seen within the kidneys bilaterally. These appear simple and no further follow-up is recommended. No renal calculi or obstructive changes are seen. The bladder is within normal limits. GI AND BOWEL: Fecal material is noted throughout the colon without obstructive change. Minimal diverticular change is noted without evidence of diverticulitis. The appendix has been surgically removed. Small bowel is within normal limits. Some wall thickening in the distal stomach is noted, which may represent gastritis. No ulceration is seen. PERITONEUM AND RETROPERITONEUM: No ascites. No free air. VASCULATURE: Aortic calcifications are noted without an aneurysmal dilatation. LYMPH NODES: No lymphadenopathy. REPRODUCTIVE ORGANS: The prostate is within normal limits. BONES AND SOFT TISSUES: No acute osseous abnormality. No focal soft tissue abnormality.  IMPRESSION: 1. No CT evidence of pancreatitis. 2. Wall thickening in the distal stomach, which may represent gastritis. 3. Stable partially calcified cystic lesion in the mid portion of the pancreas. Electronically signed by: Oneil Devonshire MD 11/12/2024 08:36 PM EST RP Workstation: [id number]   DG Chest Port 1 View Result Date: 11/12/2024 CLINICAL DATA:  Chest pain. EXAM: PORTABLE CHEST 1 VIEW COMPARISON:  Chest radiograph dated 04/09/2017. FINDINGS: No focal consolidation, pleural effusion or pneumothorax. The cardiac silhouette is within normal limits. No acute osseous pathology. IMPRESSION: No active disease. Electronically Signed   By: Vanetta Chou M.D.   On: 11/12/2024 18:10     Procedures   Medications Ordered in the ED  lactated ringers  bolus 1,000 mL (0 mLs Intravenous Stopped 11/12/24 2050)  ondansetron  (ZOFRAN ) injection 4 mg (4 mg Intravenous Given 11/12/24 1936)  iohexol  (OMNIPAQUE ) 350 MG/ML injection 80 mL (80 mLs Intravenous Contrast Given 11/12/24 2020)                                    Medical Decision Making Amount and/or Complexity of Data Reviewed Labs: ordered.    Details: Normal troponin x 2, normal lipase Radiology: ordered and independent interpretation performed.    Details: No pneumonia, no pancreatitis ECG/medicine tests: ordered and independent interpretation performed.    Details: Right bundle branch block, no acute ischemia  Risk Prescription drug management.   Patient presents with chest pain.  His PCP seem to send him here due to his significant cardiac risk factors.  This is true about how many risk factors he has but his chest pain has been a daily occurrence for many months.  Probably more of a GI problem.  CT abdomen and pelvis given his upper abdominal pain is benign besides maybe some gastritis.  He is already on Protonix , will add some Carafate.  Troponins are negative x 2, my suspicion for PE is pretty low given the length of time of  symptoms.  I do not think this is consistent with ACS.  Will refer to cardiology based on his risk factors per patient request but otherwise he appears stable for discharge home with return precautions.     Final diagnoses:  Nonspecific chest pain    ED Discharge Orders          Ordered    sucralfate (CARAFATE) 1 g tablet  3 times daily with meals &  bedtime        11/12/24 2159    Ambulatory referral to Cardiology       Comments: If you have not heard from the Cardiology office within the next 72 hours please call [phone].   11/12/24 2200               Freddi Hamilton, MD 11/12/24 2350

## 2024-11-12 NOTE — ED Notes (Signed)
 Patient transported to CT

## 2024-11-12 NOTE — ED Triage Notes (Addendum)
 Pt arrived via POV c/o on-going central chest pain X 6 weeks and describes it as if a brick is laying on my chest. Pt reports he has spoken to his family doctor about this and has gone to [facility] but the pain is still there.

## 2024-11-12 NOTE — ED Notes (Signed)
 Nsr often with  PAC/PVC's noted.

## 2024-11-12 NOTE — ED Notes (Signed)
 See triage notes. Pt in nad. C/o brick on my chest. Color wnl. Non diaphoretic. Denies other sx's.

## 2024-12-04 ENCOUNTER — Other Ambulatory Visit (INDEPENDENT_AMBULATORY_CARE_PROVIDER_SITE_OTHER): Payer: Self-pay | Admitting: Gastroenterology
